# Patient Record
Sex: Female | Born: 1955 | Race: Black or African American | Hispanic: No | Smoking: Former smoker
Health system: Southern US, Community
[De-identification: ages and names within clinical notes are randomized; demographics above are authoritative.]

## PROBLEM LIST (undated history)

## (undated) DIAGNOSIS — M1990 Unspecified osteoarthritis, unspecified site: Secondary | ICD-10-CM

## (undated) DIAGNOSIS — R12 Heartburn: Secondary | ICD-10-CM

## (undated) DIAGNOSIS — J189 Pneumonia, unspecified organism: Secondary | ICD-10-CM

## (undated) DIAGNOSIS — K219 Gastro-esophageal reflux disease without esophagitis: Secondary | ICD-10-CM

## (undated) DIAGNOSIS — C50912 Malignant neoplasm of unspecified site of left female breast: Secondary | ICD-10-CM

## (undated) DIAGNOSIS — Z8489 Family history of other specified conditions: Secondary | ICD-10-CM

## (undated) DIAGNOSIS — D649 Anemia, unspecified: Secondary | ICD-10-CM

## (undated) HISTORY — DX: Unspecified osteoarthritis, unspecified site: M19.90

## (undated) HISTORY — DX: Heartburn: R12

## (undated) HISTORY — DX: Anemia, unspecified: D64.9

## (undated) HISTORY — PX: BREAST BIOPSY: SHX20

## (undated) HISTORY — PX: COLONOSCOPY: SHX5424

## (undated) HISTORY — PX: MASTECTOMY COMPLETE / SIMPLE W/ SENTINEL NODE BIOPSY: SUR846

## (undated) HISTORY — PX: INCISION AND DRAINAGE ABSCESS ANAL: SUR669

---
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
## 2000-05-26 ENCOUNTER — Encounter: Payer: Self-pay | Admitting: *Deleted

## 2000-05-26 ENCOUNTER — Encounter: Admission: RE | Admit: 2000-05-26 | Discharge: 2000-05-26 | Payer: Self-pay | Admitting: *Deleted

## 2001-09-01 ENCOUNTER — Other Ambulatory Visit: Admission: RE | Admit: 2001-09-01 | Discharge: 2001-09-01 | Payer: Self-pay | Admitting: Obstetrics and Gynecology

## 2002-05-31 ENCOUNTER — Encounter: Payer: Self-pay | Admitting: Obstetrics and Gynecology

## 2002-05-31 ENCOUNTER — Encounter: Admission: RE | Admit: 2002-05-31 | Discharge: 2002-05-31 | Payer: Self-pay | Admitting: Obstetrics and Gynecology

## 2002-10-02 ENCOUNTER — Other Ambulatory Visit: Admission: RE | Admit: 2002-10-02 | Discharge: 2002-10-02 | Payer: Self-pay | Admitting: Obstetrics and Gynecology

## 2017-11-01 DIAGNOSIS — C50912 Malignant neoplasm of unspecified site of left female breast: Secondary | ICD-10-CM

## 2017-11-01 HISTORY — DX: Malignant neoplasm of unspecified site of left female breast: C50.912

## 2017-11-10 LAB — HM PAP SMEAR

## 2017-11-17 ENCOUNTER — Other Ambulatory Visit: Payer: Self-pay | Admitting: Radiology

## 2017-11-23 ENCOUNTER — Other Ambulatory Visit: Payer: Self-pay | Admitting: *Deleted

## 2017-11-23 ENCOUNTER — Telehealth: Payer: Self-pay | Admitting: Oncology

## 2017-11-23 ENCOUNTER — Other Ambulatory Visit: Payer: Self-pay | Admitting: Oncology

## 2017-11-23 DIAGNOSIS — Z170 Estrogen receptor positive status [ER+]: Principal | ICD-10-CM | POA: Insufficient documentation

## 2017-11-23 DIAGNOSIS — C50412 Malignant neoplasm of upper-outer quadrant of left female breast: Secondary | ICD-10-CM | POA: Insufficient documentation

## 2017-11-23 NOTE — Telephone Encounter (Signed)
Confirmed morning BC appointment with patient for 11/30/17, [facility] patient no packet sent, appointment reminder sent

## 2017-11-28 ENCOUNTER — Other Ambulatory Visit: Payer: Self-pay | Admitting: Radiology

## 2017-11-28 NOTE — Progress Notes (Signed)
[facility]  [phone] [phone]     ID: Rita Roberts DOB: [birth year]  MR#: [id number]  ZHY#:[id number]  Patient Care Team: [place] Givens, MD as PCP - General (Obstetrics and Gynecology) Magrinat, Virgie Dad, MD as Consulting Physician (Oncology) Juanita Craver, MD as Consulting Physician (Gastroenterology) Magrinat, Virgie Dad, MD as Consulting Physician (Oncology) Gery Pray, MD as Consulting Physician (Radiation Oncology) Alphonsa Overall, MD as Consulting Physician (General Surgery) OTHER MD:  CHIEF COMPLAINT: Estrogen receptor positive breast cancer  CURRENT TREATMENT: Awaiting definitive surgery   HISTORY OF CURRENT ILLNESS: Rita Roberts had routine screening mammography on 11/09/2017 showing a possible abnormality in the left breast. She underwent unilateral diagnostic mammography with tomography and left breast ultrasonography at [facility] on 11/14/2017 showing: breast density category C. At the 2 o'clock left upper outer position of the breast, there is a 1.5 cm lobulated mass that is 2 cm from the nipple. At the 1 o'clock left upper outer position of the breast, there is a 0.8 cm mass that is 8 cm from the nipple. There are several areas of clustered calcifications thoughout the upper hemisphere of the breast.   Ultrsound of the left breast and axilla at [facility] on 11/17/2017 showed no evidence of malignancy in the left axilla.   Accordingly on 11/17/2017 she proceeded to biopsy of the left breast masses in question. 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 Prognostic indicators for the invasive component was significant for: estrogen receptor, 100% positive and progesterone receptor, 100% positive, both with strong staining intensity. Proliferation  marker Ki67 at 5%. HER2 not amplified with ratio of HER2/CEP17 signals 1.33 and average HER2 copied per cell 2.60.  Biopsy of 2 additional areas in the breast in different quadrants also showed ductal carcinoma in situ.  In brief the patient had 4 left breast biopsies, 3 showing ductal carcinoma in situ and 1 showing invasive ductal carcinoma as noted above  The patient's subsequent history is as detailed below.  INTERVAL HISTORY: Rita Roberts was evaluated in the multidisciplinary breast cancer clinic on 11/30/2017. Her case was also presented at the multidisciplinary breast cancer conference on the same day. At that time a preliminary plan was proposed: Breast MRI particularly to evaluate the contralateral breast; left mastectomy and sentinel lymph node sampling; Oncotype obtained on the invasive component; adjuvant radiation if appropriate; antiestrogens.   REVIEW OF SYSTEMS: Rita Roberts reports that she is turning 62 years old in June. She notes that she has occasional constipation, and she takes stool softeners to aid this. She denies unusual headaches, visual changes, nausea, vomiting, or dizziness. There has been no unusual cough, phlegm production, or pleurisy. This been no change in bowel or bladder habits. She denies unexplained fatigue or unexplained weight loss, bleeding, rash, or fever. A detailed review of systems was otherwise stable.    PAST MEDICAL HISTORY: Past Medical History:  Diagnosis Date  . Anemia   . Arthritis   . Heartburn       PAST SURGICAL HISTORY: Past Surgical History:  Procedure Laterality Date  . CESAREAN SECTION     Boil Lancing  FAMILY HISTORY Family History  Problem Relation Age of Onset  . Lung cancer Father     She notes that her father passed away at age 86 from lung cancer and was not a smoker.  Her mother passed away at age 52 from dementia. She has 3 brothers and 1 sister. She denies a family history of breast or ovarian cancer.   GYNECOLOGIC HISTORY:    No LMP recorded. 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 She is divorced. Her son, Rita Roberts is a Glass blower/designer in [place], [place]. She does not have any grandchildren. She belongs to [employer] in [place], [place]: not in place; at the 11/30/2017 visit the patient was given the appropriate documents to complete a Rita Roberts at her discretion   HEALTH MAINTENANCE: Social History   Tobacco Use  . Smoking status: Never Smoker  . Smokeless tobacco: Never Used  Substance Use Topics  . Alcohol use: No    Frequency: Never  . Drug use: No     Colonoscopy: scheduled in March 2019/ Dr. Collene Mares  PAP: 11/14/2017  Bone density: no  First mammogram: 1977   No Known Allergies  No current outpatient medications on file.   No current facility-administered medications for this visit.     OBJECTIVE: Middle-aged African-American woman who appears younger than stated age  26:   11/30/17 0911  BP: (!) 157/87  Pulse: 73  Resp: 18  Temp: 98.3 F (36.8 C)  SpO2: 100%     Body mass index is 32.75 kg/m.   Wt Readings from Last 3 Encounters:  11/30/17 190 lb 12.8 oz (86.5 kg)      ECOG FS:0 - Asymptomatic  Ocular: Sclerae unicteric, pupils round and equal Ear-nose-throat: Oropharynx clear and moist Lymphatic: No cervical or supraclavicular adenopathy Lungs no rales or rhonchi Heart regular rate and rhythm Abd soft, nontender, positive bowel sounds MSK no focal spinal tenderness, no joint edema Neuro: non-focal, well-oriented, appropriate affect Breasts: The right breast is unremarkable.  The left breast is status post recent biopsies.  There is no palpable mass.  There are no skin or nipple  changes of concern.  Both axillae are benign.  LAB RESULTS:  CMP     Component Value Date/Time   NA 142 11/30/2017 0839   K 3.7 11/30/2017 0839   CL 105 11/30/2017 0839   CO2 28 11/30/2017 0839   GLUCOSE 109 11/30/2017 0839   BUN 10 11/30/2017 0839   CALCIUM 9.9 11/30/2017 0839   PROT 7.6 11/30/2017 0839   ALBUMIN 4.1 11/30/2017 0839   AST 16 11/30/2017 0839   ALT 11 11/30/2017 0839   ALKPHOS 59 11/30/2017 0839   BILITOT 1.1 11/30/2017 0839   GFRNONAA >60 11/30/2017 0839   GFRAA >60 11/30/2017 0839    No results found for: TOTALPROTELP, ALBUMINELP, A1GS, A2GS, BETS, BETA2SER, GAMS, MSPIKE, SPEI  No results found for: KPAFRELGTCHN, LAMBDASER, [facility]  Lab Results  Component Value Date   WBC 4.2 11/30/2017   NEUTROABS 2.3 11/30/2017   HCT 41.5 11/30/2017   MCV 87.9 11/30/2017   PLT 247 11/30/2017    '@LASTCHEMISTRY'$ @  No results found for: LABCA2  No components found for: IPJASN053  No results for input(s): INR in the last 168 hours.  No results found for: LABCA2  No results found for: ZJQ734  No results found for: LPF790  No results found for: WIO973  No results  found for: CA2729  No components found for: HGQUANT  No results found for: CEA1 / No results found for: CEA1   No results found for: AFPTUMOR  No results found for: [place]  No results found for: PSA1  Appointment on 11/30/2017  Component Date Value Ref Range Status  . Sodium 11/30/2017 142  136 - 145 mmol/L Final  . Potassium 11/30/2017 3.7  3.3 - 4.7 mmol/L Final  . Chloride 11/30/2017 105  98 - 109 mmol/L Final  . CO2 11/30/2017 28  22 - 29 mmol/L Final  . Glucose, Bld 11/30/2017 109  70 - 140 mg/dL Final  . BUN 11/30/2017 10  7 - 26 mg/dL Final  . Creatinine 11/30/2017 0.98  0.60 - 1.10 mg/dL Final  . Calcium 11/30/2017 9.9  8.4 - 10.4 mg/dL Final  . Total Protein 11/30/2017 7.6  6.4 - 8.3 g/dL Final  . Albumin 11/30/2017 4.1  3.5 - 5.0 g/dL Final  . AST 11/30/2017 16  5 -  34 U/L Final  . ALT 11/30/2017 11  0 - 55 U/L Final  . Alkaline Phosphatase 11/30/2017 59  40 - 150 U/L Final  . Total Bilirubin 11/30/2017 1.1  0.2 - 1.2 mg/dL Final  . GFR, Est Non Af Am 11/30/2017 >60  >60 mL/min Final  . GFR, Est AFR Am 11/30/2017 >60  >60 mL/min Final   Comment: (NOTE) The eGFR has been calculated using the CKD EPI equation. This calculation has not been validated in all clinical situations. eGFR's persistently <60 mL/min signify possible Chronic Kidney Disease.   Georgiann Hahn gap 11/30/2017 9  3 - 11 Final   Performed at [facility], [place] [address], [place], [place] [address]  . WBC Count 11/30/2017 4.2  3.9 - 10.3 K/uL Final  . RBC 11/30/2017 4.72  3.70 - 5.45 MIL/uL Final  . Hemoglobin 11/30/2017 13.7  11.6 - 15.9 g/dL Final  . HCT 11/30/2017 41.5  34.8 - 46.6 % Final  . MCV 11/30/2017 87.9  79.5 - 101.0 fL Final  . MCH 11/30/2017 29.1  25.1 - 34.0 pg Final  . MCHC 11/30/2017 33.1  31.5 - 36.0 g/dL Final  . RDW 11/30/2017 13.6  11.2 - 16.1 % Final  . Platelet Count 11/30/2017 247  145 - 400 K/uL Final  . Neutrophils Relative % 11/30/2017 54  % Final  . Neutro Abs 11/30/2017 2.3  1.5 - 6.5 K/uL Final  . Lymphocytes Relative 11/30/2017 35  % Final  . Lymphs Abs 11/30/2017 1.4  0.9 - 3.3 K/uL Final  . Monocytes Relative 11/30/2017 9  % Final  . Monocytes Absolute 11/30/2017 0.4  0.1 - 0.9 K/uL Final  . Eosinophils Relative 11/30/2017 1  % Final  . Eosinophils Absolute 11/30/2017 0.0  0.0 - 0.5 K/uL Final  . Basophils Relative 11/30/2017 1  % Final  . Basophils Absolute 11/30/2017 0.0  0.0 - 0.1 K/uL Final   Performed at [facility], [place] [address] [place], [place] [address]    (this displays the last labs from the last 3 days)  No results found for: TOTALPROTELP, ALBUMINELP, A1GS, A2GS, BETS, BETA2SER, GAMS, MSPIKE, SPEI (this displays SPEP labs)  No results found for: KPAFRELGTCHN, LAMBDASER,  KAPLAMBRATIO (kappa/lambda light chains)  No results found for: HGBA, HGBA2QUANT, HGBFQUANT, HGBSQUAN (Hemoglobinopathy evaluation)   No results found for: LDH  No results found for: IRON, TIBC, IRONPCTSAT (Iron and TIBC)  No results found for: FERRITIN  Urinalysis No  results found for: COLORURINE, APPEARANCEUR, LABSPEC, PHURINE, GLUCOSEU, HGBUR, BILIRUBINUR, KETONESUR, PROTEINUR, UROBILINOGEN, NITRITE, LEUKOCYTESUR   STUDIES: Outside studies reviewed with the patient  ELIGIBLE FOR AVAILABLE RESEARCH PROTOCOL: no  ASSESSMENT: 62 y.o. [place], [place] Woman status post left breast upper outer quadrant biopsy 11/17/2017 showing invasive ductal carcinoma, grade 1, estrogen and progesterone receptor positive, HER-2 not amplified, with an MIB-1 of 5%.  (a) 3 additional left breast biopsies in different quadrants showed ductal carcinoma in situ  (1) left mastectomy and sentinel lymph node sampling planned  (2) Oncotype to be obtained from the definitive surgical sample: Chemotherapy not anticipated  (3) adjuvant radiation likely not needed  (4) antiestrogens to start at the completion of local treatment  PLAN: We spent the better part of today's hour-long appointment discussing the biology of her diagnosis and the specifics of her situation. We first reviewed the fact that cancer is not one disease but more than 100 different diseases and that it is important to keep them separate-- otherwise when friends and relatives discuss their own cancer experiences with Rita Roberts confusion can result. Similarly we explained that if breast cancer spreads to the bone or liver, the patient would not have bone cancer or liver cancer, but breast cancer in the bone and breast cancer in the liver: one cancer in three places-- not 3 different cancers which otherwise would have to be treated in 3 different ways.  We discussed the difference between local and systemic therapy. In terms of loco-regional  treatment, lumpectomy plus radiation is equivalent to mastectomy as far as survival is concerned.  However, given that she has multiple noninvasive areas in different quadrants of the left breast, mastectomy cannot be avoided in her case.   We also noted that in terms of sequencing of treatments, whether systemic therapy or surgery is done first does not affect the ultimate outcome.  If there is to be in a significant delay in her surgery, because of reconstruction or other concerns, we can always start anti-estrogens neoadjuvantly.  We then discussed the rationale for systemic therapy. There is some risk that this cancer may have already spread to other parts of her body. Patients frequently ask at this point about bone scans, CAT scans and PET scans to find out if they have occult breast cancer somewhere else. The problem is that in early stage disease we are much more likely to find false positives then true cancers and this would expose the patient to unnecessary procedures as well as unnecessary radiation. Scans cannot answer the question the patient really would like to know, which is whether she has microscopic disease elsewhere in her body. For those reasons we do not recommend them.  Of course we would proceed to aggressive evaluation of any symptoms that might suggest metastatic disease, but that is not the case here.  Next we went over the options for systemic therapy which are anti-estrogens, anti-HER-2 immunotherapy, and chemotherapy. Rita Roberts does not meet criteria for anti-HER-2 immunotherapy. She is a good candidate for anti-estrogens.  The question of chemotherapy is more complicated. Chemotherapy is most effective in rapidly growing, aggressive tumors. It is much less effective in low-grade, slow growing cancers, like Rita Roberts 's. For that reason we are going to request an Oncotype from the definitive surgical sample, as suggested by NCCN guidelines. That will help [place] make a definitive decision  regarding chemotherapy in this case, but she understands I do not anticipate she will need chemotherapy  Finally we discussed her noninvasive disease.  She understands this is cured with mastectomy.  Accordingly the final plan is for surgery, likely no chemotherapy and likely no radiation.  She will see me in approximately 5 weeks to discuss anti-estrogens  Today I gave her documents that she can complete a healthcare power of attorney and get it notarized.  She will let me know at the next visit whether she was able to accomplish that  [place]aler has a good understanding of the overall plan. She agrees with it. She knows the goal of treatment in her case is cure. She will call with any problems that may develop before her next visit here.   Magrinat, Virgie Dad, MD  11/30/17 11:16 AM Medical Oncology and Hematology [facility] [address] [place], [place] [address] Tel. [phone]    Fax. [phone]  This document serves as a record of services personally performed by Lurline Del, MD. It was created on his behalf by Sheron Nightingale, a trained medical scribe. The creation of this record is based on the scribe's personal observations and the provider's statements to them.   I have reviewed the above documentation for accuracy and completeness, and I agree with the above.

## 2017-11-30 ENCOUNTER — Other Ambulatory Visit: Payer: Self-pay | Admitting: *Deleted

## 2017-11-30 ENCOUNTER — Encounter: Payer: Self-pay | Admitting: *Deleted

## 2017-11-30 ENCOUNTER — Inpatient Hospital Stay: Payer: BC Managed Care – PPO | Attending: Oncology | Admitting: Oncology

## 2017-11-30 ENCOUNTER — Other Ambulatory Visit: Payer: Self-pay

## 2017-11-30 ENCOUNTER — Ambulatory Visit: Payer: BC Managed Care – PPO | Attending: Surgery | Admitting: Physical Therapy

## 2017-11-30 ENCOUNTER — Encounter: Payer: Self-pay | Admitting: Physical Therapy

## 2017-11-30 ENCOUNTER — Inpatient Hospital Stay: Payer: BC Managed Care – PPO

## 2017-11-30 ENCOUNTER — Encounter: Payer: Self-pay | Admitting: Oncology

## 2017-11-30 ENCOUNTER — Ambulatory Visit
Admission: RE | Admit: 2017-11-30 | Discharge: 2017-11-30 | Disposition: A | Discharge status: Home / Self Care | Payer: BC Managed Care – PPO | Source: Ambulatory Visit | Attending: Radiation Oncology | Admitting: Radiation Oncology

## 2017-11-30 VITALS — BP 157/87 | HR 73 | Temp 98.3°F | Resp 18 | Ht 64.0 in | Wt 190.8 lb

## 2017-11-30 DIAGNOSIS — Z170 Estrogen receptor positive status [ER+]: Secondary | ICD-10-CM | POA: Diagnosis not present

## 2017-11-30 DIAGNOSIS — D0512 Intraductal carcinoma in situ of left breast: Secondary | ICD-10-CM

## 2017-11-30 DIAGNOSIS — C50412 Malignant neoplasm of upper-outer quadrant of left female breast: Secondary | ICD-10-CM | POA: Diagnosis not present

## 2017-11-30 DIAGNOSIS — R293 Abnormal posture: Secondary | ICD-10-CM | POA: Diagnosis present

## 2017-11-30 LAB — CBC WITH DIFFERENTIAL (CANCER CENTER ONLY)
BASOS PCT: 1 %
Basophils Absolute: 0 10*3/uL (ref 0.0–0.1)
EOS ABS: 0 10*3/uL (ref 0.0–0.5)
Eosinophils Relative: 1 %
HEMATOCRIT: 41.5 % (ref 34.8–46.6)
HEMOGLOBIN: 13.7 g/dL (ref 11.6–15.9)
Lymphocytes Relative: 35 %
Lymphs Abs: 1.4 10*3/uL (ref 0.9–3.3)
MCH: 29.1 pg (ref 25.1–34.0)
MCHC: 33.1 g/dL (ref 31.5–36.0)
MCV: 87.9 fL (ref 79.5–101.0)
MONOS PCT: 9 %
Monocytes Absolute: 0.4 10*3/uL (ref 0.1–0.9)
NEUTROS ABS: 2.3 10*3/uL (ref 1.5–6.5)
Neutrophils Relative %: 54 %
Platelet Count: 247 10*3/uL (ref 145–400)
RBC: 4.72 MIL/uL (ref 3.70–5.45)
RDW: 13.6 % (ref 11.2–16.1)
WBC: 4.2 10*3/uL (ref 3.9–10.3)

## 2017-11-30 LAB — CMP (CANCER CENTER ONLY)
ALK PHOS: 59 U/L (ref 40–150)
ALT: 11 U/L (ref 0–55)
ANION GAP: 9 (ref 3–11)
AST: 16 U/L (ref 5–34)
Albumin: 4.1 g/dL (ref 3.5–5.0)
BUN: 10 mg/dL (ref 7–26)
CALCIUM: 9.9 mg/dL (ref 8.4–10.4)
CHLORIDE: 105 mmol/L (ref 98–109)
CO2: 28 mmol/L (ref 22–29)
Creatinine: 0.98 mg/dL (ref 0.60–1.10)
Glucose, Bld: 109 mg/dL (ref 70–140)
Potassium: 3.7 mmol/L (ref 3.3–4.7)
SODIUM: 142 mmol/L (ref 136–145)
Total Bilirubin: 1.1 mg/dL (ref 0.2–1.2)
Total Protein: 7.6 g/dL (ref 6.4–8.3)

## 2017-11-30 NOTE — Progress Notes (Signed)
Radiation Oncology         [phone] ________________________________  Initial outpatient Consultation  Name: Rita Roberts MRN: [id number]  Date: 11/30/2017  DOB: January 23, 1956  WI:[id number], Rita Munson, MD  Alphonsa Overall, MD   REFERRING PHYSICIAN: Alphonsa Overall, MD  DIAGNOSIS: The encounter diagnosis was Malignant neoplasm of upper-outer quadrant of left breast in female, estrogen receptor positive ([place]).   HISTORY OF PRESENT ILLNESS::Rita Roberts is a 62 y.o. female who is seen as part of the multidisciplinary breast clinic today. Earlier the patient's imaging and pathology was reviewed at the multidisciplinary breast conference. She underwent routine screening mammography on 11/09/2017 showing a possible abnormality in the left breast. She underwent unilateral diagnostic mammography with tomography and left breast ultrasonography at [facility] on 11/14/2017 showing: breast density category C. At the 2 o'clock left upper outer position of the breast, there is a 1.5 cm lobulated mass that is 2 cm from the nipple. At the 1 o'clock left upper outer position of the breast, there is a 0.8 cm mass that is 8 cm from the nipple. There are several areas of clustered calcifications thoughout the upper hemisphere of the breast. Ultrsound of the left breast and axilla at [facility] on 11/17/2017 showed no evidence of malignancy in the left axilla.   Accordingly on 11/17/2017 she proceeded to biopsy of the left breast masses in question. 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 Prognostic indicators for the invasive component was significant for: estrogen receptor, 100% positive and progesterone receptor, 100% positive, both with strong staining intensity. Proliferation marker Ki67 at 5%. HER2 not amplified with ratio of  HER2/CEP17 signals 1.33 and average HER2 copied per cell 2.60.  Biopsy of 2 additional areas in the breast in different quadrants also showed ductal carcinoma in situ.  In brief the patient had 4 left breast biopsies, 3 showing ductal carcinoma in situ and 1 showing invasive ductal carcinoma as noted above. With this information the patient is now seen for evaluation and management recommendations.   GYNECOLOGIC HISTORY:  No LMP recorded. Menarche: 62 years old Age at first live birth: 62 years old GXP1 LMP December 2011 Contraceptive: oral pills from 4196-2229 with no complications HRT: no     PREVIOUS RADIATION THERAPY: No  PAST MEDICAL HISTORY:  has a past medical history of Anemia, Arthritis, and Heartburn.    PAST SURGICAL HISTORY: Past Surgical History:  Procedure Laterality Date  . CESAREAN SECTION      FAMILY HISTORY: family history includes Lung cancer in her father.  SOCIAL HISTORY:  reports that  has never smoked. she has never used smokeless tobacco. She reports that she does not drink alcohol or use drugs. 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 She is divorced. Her son, Rita Roberts is a Glass blower/designer in [place], [place]. She does not have any grandchildren. She belongs to [employer] in [place], [place]: Patient has no known allergies.  MEDICATIONS:  No current outpatient medications on file.   No current facility-administered medications for this encounter.     REVIEW OF SYSTEMS: REVIEW OF SYSTEMS: A 10+ POINT REVIEW OF SYSTEMS WAS OBTAINED including neurology, dermatology, psychiatry, cardiac, respiratory, lymph, extremities, GI, GU, musculoskeletal, constitutional, reproductive, HEENT. All pertinent positives are noted  in the HPI. All others are negative. Prior to diagnosis patient denied any pain in the breast area nipple discharge or bleeding   PHYSICAL EXAM:  Vitals - 1 value  per visit 05/26/3663  SYSTOLIC 403  DIASTOLIC 87  Pulse 73  Temperature 98.3  Respirations 18  Weight (lb) 190.8  Height _0   BMI 32.75  VISIT REPORT    Lungs are clear to auscultation bilaterally. Heart has regular rate and rhythm. No palpable cervical, supraclavicular, or axillary adenopathy. Abdomen soft, non-tender, normal bowel sounds. Examination of the right breast reveals no palpable mass nipple discharge or bleeding. Examination of the left breast reveals Steri-Strips in place in 2 locations of the upper breast. The patient also has some bruising in the upper outer quadrant of left breast. No palpable mass nipple discharge or bleeding.   ECOG = 0  0 - Asymptomatic (Fully active, able to carry on all predisease activities without restriction)  1 - Symptomatic but completely ambulatory (Restricted in physically strenuous activity but ambulatory and able to carry out work of a light or sedentary nature. For example, light housework, office work)  2 - Symptomatic, <50% in bed during the day (Ambulatory and capable of all self care but unable to carry out any work activities. Up and about more than 50% of waking hours)  3 - Symptomatic, >50% in bed, but not bedbound (Capable of only limited self-care, confined to bed or chair 50% or more of waking hours)  4 - Bedbound (Completely disabled. Cannot carry on any self-care. Totally confined to bed or chair)  5 - Death   Rita Roberts MM, Rita Roberts, Rita Roberts, et al. [phone]). "Toxicity and response criteria of the [facility]". [place] Oncol. 5 (6): 649-55  LABORATORY DATA:  Lab Results  Component Value Date   WBC 4.2 11/30/2017   HCT 41.5 11/30/2017   MCV 87.9 11/30/2017   PLT 247 11/30/2017   NEUTROABS 2.3 11/30/2017   Lab Results  Component Value Date   NA 142 11/30/2017   K 3.7 11/30/2017   CL 105 11/30/2017   CO2 28 11/30/2017   GLUCOSE 109 11/30/2017   CALCIUM 9.9 11/30/2017      RADIOGRAPHY:  No results found.    IMPRESSION: 62 y.o. [place], [place] Woman status post left breast upper outer quadrant biopsy 11/17/2017 showing invasive ductal carcinoma, grade 1, estrogen and progesterone receptor positive, HER-2 not amplified, with an MIB-1 of 5%.  3 additional left breast biopsies in different quadrants showed ductal carcinoma in situ. Given the extent of disease within the left breast she would not be a candidate for breast conserving surgery. We would recommend mastectomy and sentinel node procedure as part of her definitive management. If thePatient is found to have small lesions and no evidence of lymph node metastasis, clear margins then likely she would not require postmastectomy radiation therapy as part of her overall management.    PLAN:   (1) left mastectomy and sentinel lymph node sampling planned  (2) Oncotype to be obtained from the definitive surgical sample: Chemotherapy not anticipated  (3) adjuvant radiation likely not needed  (4) antiestrogens to start at the completion of local treatment       ------------------------------------------------  Blair Promise, PhD, MD

## 2017-11-30 NOTE — Progress Notes (Signed)
Nutrition Assessment  Reason for Assessment:  Pt seen in Breast Clinic  ASSESSMENT:   62 year old female with new diagnosis of breast cancer.  Past medical history reviewed  Patient reports good appetite  Medications:  reviewed  Labs: reviewed  Anthropometrics:   Height: 64 inches Weight: 190 lb BMI: 32   NUTRITION DIAGNOSIS: Food and nutrition related knowledge deficit related to new diagnosis of breast cancer as evidenced by no prior need for nutrition related information.  INTERVENTION:   Discussed and provided packet of information regarding nutritional tips for breast cancer patients.  Questions answered.  Teachback method used.  Contact information provided and patient knows to contact me with questions/concerns.    MONITORING, EVALUATION, and GOAL: Pt will consume a healthy plant based diet to maintain lean body mass throughout treatment.   Mialynn Shelvin B. Zenia Resides, [place], [place] Registered Dietitian [phone] (pager)

## 2017-11-30 NOTE — Patient Instructions (Signed)

## 2017-11-30 NOTE — Therapy (Signed)
[place], [place], [address] Phone: [phone]   Fax:  [phone]  Physical Therapy Evaluation  Patient Details  Name: Rita Roberts MRN: [id number] Date of Birth: 06/15/1956 Referring Provider: Dr. Alphonsa Overall   Encounter Date: 11/30/2017  PT End of Session - 11/30/17 1159    Visit Number  1    Number of Visits  2    Date for PT Re-Evaluation  01/25/18    PT Start Time  2992    PT Stop Time  0942 Also saw pt from [phone] for a total of 32 minutes    PT Time Calculation (min)  28 min    Activity Tolerance  Patient tolerated treatment well    Behavior During Therapy  [facility] for tasks assessed/performed       Past Medical History:  Diagnosis Date  . Anemia   . Arthritis   . Heartburn     Past Surgical History:  Procedure Laterality Date  . CESAREAN SECTION      There were no vitals filed for this visit.   Subjective Assessment - 11/30/17 1150    Subjective  Patient reports she is here today to be seen by her medical team for her newly diagnosed left breast cancer.    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 It measures 1.4 cm and is located in the upper outer quadrant. It is ER/PR positive and HER2 negative with a Ki67 of 5%. She has no other medical problems.    Patient Stated Goals  reduce lymphedema risk and learn post op HEP     Currently in Pain?  No/denies         [facility] PT Assessment - 11/30/17 0001      Assessment   Medical Diagnosis  Left breast cancer    Referring Provider  Dr. Alphonsa Overall    Onset Date/Surgical Date  11/09/17    Hand Dominance  Right    Prior Therapy  none      Precautions   Precautions  Other (comment)    Precaution Comments  active cancer      Restrictions   Weight Bearing Restrictions  No      Balance Screen   Has the patient fallen in the past 6 months  No    Has the  patient had a decrease in activity level because of a fear of falling?   No    Is the patient reluctant to leave their home because of a fear of falling?   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    Leisure  She walks 3x/week for 15 minutes      Cognition   Overall Cognitive Status  Within Functional Limits for tasks assessed      Posture/Postural Control   Posture/Postural Control  Postural limitations    Postural Limitations  Rounded Shoulders;Forward head      ROM / Strength   AROM / PROM / Strength  AROM;Strength      AROM   AROM Assessment Site  Shoulder;Cervical    Right/Left Shoulder  Right;Left    Right Shoulder Extension  45 Degrees    Right Shoulder Flexion  148 Degrees    Right Shoulder ABduction  150 Degrees    Right Shoulder Internal Rotation  54 Degrees    Right Shoulder External Rotation  86 Degrees    Left Shoulder Extension  52 Degrees    Left Shoulder Flexion  155 Degrees    Left Shoulder ABduction  155 Degrees    Left Shoulder Internal Rotation  58 Degrees    Left Shoulder External Rotation  88 Degrees    Cervical Flexion  WNL    Cervical Extension  WNL    Cervical - Right Side Bend  WNL    Cervical - Left Side Bend  WNL    Cervical - Right Rotation  WNL    Cervical - Left Rotation  WNL      Strength   Overall Strength  Within functional limits for tasks performed        LYMPHEDEMA/ONCOLOGY QUESTIONNAIRE - 11/30/17 1156      Type   Cancer Type  Left breast cancer      Lymphedema Assessments   Lymphedema Assessments  Upper extremities      Right Upper Extremity Lymphedema   10 cm Proximal to Olecranon Process  32.7 cm    Olecranon Process  27.8 cm    10 cm Proximal to Ulnar Styloid Process  25.5 cm     Just Proximal to Ulnar Styloid Process  18.6 cm    Across Hand at [employer]  20.3 cm    At [place] of 2nd Digit  7.2 cm      Left Upper Extremity Lymphedema   10 cm Proximal to Olecranon Process  30.7 cm    Olecranon Process  26.8 cm    10 cm Proximal to Ulnar Styloid Process  24.6 cm    Just Proximal to Ulnar Styloid Process  17.5 cm    Across Hand at [employer]  20.2 cm    At [place] of 2nd Digit  6.7 cm          Objective measurements completed on examination: See above findings.      Patient was instructed today in a home exercise program today for post op shoulder range of motion. These included active assist shoulder flexion in sitting, scapular retraction, wall walking with shoulder abduction, and hands behind head external rotation.  She was encouraged to do these twice a day, holding 3 seconds and repeating 5 times when permitted by her physician.      PT Education - 11/30/17 1158    Education provided  Yes    Education Details  Lymphedema risk reduction and post op shoulder ROM HEP    Person(s) Educated  Patient    Methods  Explanation;Demonstration;Handout    Comprehension  Returned demonstration;Verbalized understanding          PT Long Term Goals - 11/30/17 1203      PT LONG TERM GOAL #1   Title  Patient will demonstrate she has returned to baseline with shoulder ROM and function post-operatively.    Time  [facility] Goals - 11/30/17 1204      Patient will be able to verbalize understanding of pertinent lymphedema risk reduction practices relevant to her diagnosis specifically related to skin care.   Time  1    Period  Days  Status  Achieved      Patient will be able to return demonstrate and/or verbalize understanding of the post-op home exercise program related to regaining shoulder range of motion.   Time  1    Period  Days    Status  Achieved      Patient will be able to verbalize understanding of the  importance of attending the postoperative After Breast Cancer Class for further lymphedema risk reduction education and therapeutic exercise.   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 It measures 1.4 cm and is located in the upper outer quadrant. It is ER/PR positive and HER2 negative with a Ki67 of 5%. She has no other medical problems. Her multidisciplinary medical team met prior other assessments to determine a recommended treatment plan. She is planning to have a left mastectomy and sentinel node biopsy followed by Oncotype testing and anti-estrogen therapy. She will benefit from a post op PT visit to determine PT needs.    History and Personal Factors relevant to plan of care:  Lives alone    Clinical Presentation  Stable    Clinical Decision Making  Low    Rehab Potential  Excellent    Clinical Impairments Affecting Rehab Potential  None    PT Frequency  -- Eval and 1 f/u visit    PT Treatment/Interventions  ADLs/Self Care Home Management;Patient/family education;Therapeutic exercise    PT Next Visit Plan  Will reassess 3-4 weeks post op    PT Home Exercise Plan  Post op shoulder ROM HEP    Consulted and Agree with Plan of Care  Patient       Patient will benefit from skilled therapeutic intervention in order to improve the following deficits and impairments:  Decreased knowledge of precautions, Impaired UE functional use, Postural dysfunction, Decreased range of motion, Pain  Visit Diagnosis: Malignant neoplasm of upper-outer quadrant of left breast in female, estrogen receptor positive ([place]) - Plan: PT plan of care cert/re-cert  Abnormal posture - Plan: PT plan of care cert/re-cert   Patient will follow up at outpatient cancer rehab 3-4 weeks following surgery.  If the patient requires physical therapy at that  time, a specific plan will be dictated and sent to the referring physician for approval. The patient was educated today on appropriate basic range of motion exercises to begin post operatively and the importance of attending the After Breast Cancer class following surgery.  Patient was educated today on lymphedema risk reduction practices as it pertains to recommendations that will benefit the patient immediately following surgery.  She verbalized good understanding.      Problem List Patient Active Problem List   Diagnosis Date Noted  . Malignant neoplasm of upper-outer quadrant of left breast in female, estrogen receptor positive ([place]) 11/23/2017    Annia Friendly, PT 11/30/17 12:06 PM  [place], [place], [address] Phone: [phone]   Fax:  [phone]  Name: Rita Roberts MRN: [id number] Date of Birth: January 27, 1956

## 2017-11-30 NOTE — Progress Notes (Signed)
Clinical Social Work [place] Psychosocial Distress Screening [place]  Patient completed distress screening protocol and scored a 1 on the Psychosocial Distress Thermometer which indicates mild distress. Clinical Social Worker met with patient in [facility] to assess for distress and other psychosocial needs. Patient stated she was feeling "nervous" but felt "better" after meeting with the treatment team and getting more information on her treatment plan. CSW and patient discussed common feeling and emotions when being diagnosed with cancer, and the importance of support during treatment. CSW informed patient of the support team and support services at [facility]. CSW provided contact information and encouraged patient to call with any questions or concerns.  ONCBCN DISTRESS SCREENING 11/30/2017  Screening Type Initial Screening  Distress experienced in past week (1-10) 1  Emotional problem type Nervousness/Anxiety  Physical Problem type Sleep/insomnia  Physician notified of physical symptoms Yes     Johnnye Lana, MSW, LCSW, OSW-C Clinical Social Worker [place] [phone]

## 2017-12-07 ENCOUNTER — Telehealth: Payer: Self-pay | Admitting: *Deleted

## 2017-12-07 ENCOUNTER — Ambulatory Visit (HOSPITAL_COMMUNITY)
Admission: RE | Admit: 2017-12-07 | Discharge: 2017-12-07 | Disposition: A | Discharge status: Home / Self Care | Payer: BC Managed Care – PPO | Source: Ambulatory Visit | Attending: Surgery | Admitting: Surgery

## 2017-12-07 DIAGNOSIS — Z170 Estrogen receptor positive status [ER+]: Secondary | ICD-10-CM | POA: Insufficient documentation

## 2017-12-07 DIAGNOSIS — C50412 Malignant neoplasm of upper-outer quadrant of left female breast: Secondary | ICD-10-CM | POA: Diagnosis present

## 2017-12-07 MED ORDER — GADOBENATE DIMEGLUMINE 529 MG/ML IV SOLN
20.0000 mL | Freq: Once | INTRAVENOUS | Status: AC | PRN
  Administered 2017-12-07: 18 mL via INTRAVENOUS

## 2017-12-07 NOTE — Telephone Encounter (Signed)
Spoke with pt concerning [place] from 1.30.19. Denies questions or concerns regarding dx or treatment care plan. Encourage pt to call with needs. Received verbal understanding. Contact information provided. Pt relate she did well with breast MRI.

## 2017-12-08 ENCOUNTER — Ambulatory Visit: Payer: Self-pay | Admitting: Surgery

## 2017-12-08 DIAGNOSIS — C50912 Malignant neoplasm of unspecified site of left female breast: Secondary | ICD-10-CM

## 2017-12-16 ENCOUNTER — Other Ambulatory Visit: Payer: Self-pay | Admitting: *Deleted

## 2017-12-16 MED ORDER — ANASTROZOLE 1 MG PO TABS: 1.0000 mg | ORAL_TABLET | Freq: Every day | ORAL | 2 refills | Status: DC

## 2017-12-16 NOTE — Telephone Encounter (Signed)
Spoke with patient concerning her appointment with Dr. Jana Hakim 3/11.  She is not having surgery until 4/11 due to going out of town.  Confirmed new appointment for 03/06/18 at 230pm.  Also informed her that Dr. Jana Hakim would like her to start anastrozole in the meantime since it will be awhile before surgery.  RX called into her pharmacy.

## 2018-01-02 LAB — HM COLONOSCOPY

## 2018-01-09 ENCOUNTER — Telehealth: Payer: Self-pay | Admitting: *Deleted

## 2018-01-09 ENCOUNTER — Ambulatory Visit: Payer: BC Managed Care – PPO | Admitting: Oncology

## 2018-01-09 MED ORDER — ANASTROZOLE 1 MG PO TABS: 1.0000 mg | ORAL_TABLET | Freq: Every day | ORAL | 2 refills | Status: DC

## 2018-01-09 NOTE — Telephone Encounter (Signed)
Patient called to get Anastrozole resent to her pharmacy. She did not pick up prescription and pharmacy will not release without a new script being sent.   Reorder sent to [facility] on [address].

## 2018-01-31 ENCOUNTER — Other Ambulatory Visit: Payer: Self-pay | Admitting: Obstetrics and Gynecology

## 2018-01-31 NOTE — Pre-Procedure Instructions (Signed)
Rita Roberts  01/31/2018      [facility] [place] [address] [place] [address] Phone: [phone] Fax: [phone]    Your procedure is scheduled on April 11  Report to [facility] at [place].M.  Call this number if you have problems the morning of surgery:  [phone]   Remember:  Do not eat food or drink liquids after midnight.  Continue all medications as directed by your physician except follow these medication instructions before surgery below   Take these medicines the morning of surgery with A SIP OF WATER NONE  7 days prior to surgery STOP taking any Aspirin(unless otherwise instructed by your surgeon), Aleve, Naproxen, Ibuprofen, Motrin, Advil, Goody's, BC's, all herbal medications, fish oil, and all vitamins     Do not wear jewelry, make-up or nail polish.  Do not wear lotions, powders, or perfumes, or deodorant.  Do not shave 48 hours prior to surgery.    Do not bring valuables to the hospital.  [facility] is not responsible for any belongings or valuables.  Contacts, dentures or bridgework may not be worn into surgery.  Leave your suitcase in the car.  After surgery it may be brought to your room.  For patients admitted to the hospital, discharge time will be determined by your treatment team.  Patients discharged the day of surgery will not be allowed to drive home.    Special instructions:   [place]- Preparing For Surgery  Before surgery, you can play an important role. Because skin is not sterile, your skin needs to be as free of germs as possible. You can reduce the number of germs on your skin by washing with CHG (chlorahexidine gluconate) Soap before surgery.  CHG is an antiseptic cleaner which kills germs and bonds with the skin to continue killing germs even after washing.  Please do not use if you have an allergy to CHG  or antibacterial soaps. If your skin becomes reddened/irritated stop using the CHG.  Do not shave (including legs and underarms) for at least 48 hours prior to first CHG shower. It is OK to shave your face.  Please follow these instructions carefully.   1. Shower the NIGHT BEFORE SURGERY and the MORNING OF SURGERY with CHG.   2. If you chose to wash your hair, wash your hair first as usual with your normal shampoo.  3. After you shampoo, rinse your hair and body thoroughly to remove the shampoo.  4. Use CHG as you would any other liquid soap. You can apply CHG directly to the skin and wash gently with a scrungie or a clean washcloth.   5. Apply the CHG Soap to your body ONLY FROM THE NECK DOWN.  Do not use on open wounds or open sores. Avoid contact with your eyes, ears, mouth and genitals (private parts). Wash Face and genitals (private parts)  with your normal soap.  6. Wash thoroughly, paying special attention to the area where your surgery will be performed.  7. Thoroughly rinse your body with warm water from the neck down.  8. DO NOT shower/wash with your normal soap after using and rinsing off the CHG Soap.  9. Pat yourself dry with a CLEAN TOWEL.  10. Wear CLEAN PAJAMAS to bed the night before surgery, wear comfortable clothes the morning of surgery  11. Place  CLEAN SHEETS on your bed the night of your first shower and DO NOT SLEEP WITH PETS.    Day of Surgery: Do not apply any deodorants/lotions. Please wear clean clothes to the hospital/surgery center.      Please read over the following fact sheets that you were given.

## 2018-02-01 ENCOUNTER — Other Ambulatory Visit: Payer: Self-pay

## 2018-02-01 ENCOUNTER — Encounter (HOSPITAL_COMMUNITY)
Admission: RE | Admit: 2018-02-01 | Discharge: 2018-02-01 | Disposition: A | Discharge status: Home / Self Care | Payer: BC Managed Care – PPO | Source: Ambulatory Visit | Attending: Surgery | Admitting: Surgery

## 2018-02-01 ENCOUNTER — Encounter (HOSPITAL_COMMUNITY): Payer: Self-pay | Admitting: *Deleted

## 2018-02-01 DIAGNOSIS — Z01810 Encounter for preprocedural cardiovascular examination: Secondary | ICD-10-CM | POA: Insufficient documentation

## 2018-02-01 DIAGNOSIS — Z01812 Encounter for preprocedural laboratory examination: Secondary | ICD-10-CM | POA: Diagnosis present

## 2018-02-01 DIAGNOSIS — R030 Elevated blood-pressure reading, without diagnosis of hypertension: Secondary | ICD-10-CM | POA: Diagnosis not present

## 2018-02-01 LAB — BASIC METABOLIC PANEL
ANION GAP: 9 (ref 5–15)
BUN: 10 mg/dL (ref 6–20)
CO2: 24 mmol/L (ref 22–32)
CREATININE: 0.79 mg/dL (ref 0.44–1.00)
Calcium: 9.4 mg/dL (ref 8.9–10.3)
Chloride: 107 mmol/L (ref 101–111)
GFR calc non Af Amer: >60 mL/min (ref >60)
Glucose, Bld: 91 mg/dL (ref 65–99)
POTASSIUM: 3.6 mmol/L (ref 3.5–5.1)
SODIUM: 140 mmol/L (ref 135–145)

## 2018-02-01 LAB — HEMOGLOBIN: Hemoglobin: 13.6 g/dL (ref 12.0–15.0)

## 2018-02-01 NOTE — Progress Notes (Signed)
PCP - Naima Dillard Cardiologist - denies  Chest x-ray - not needed EKG - 02/01/18 Stress Test - denies ECHO - denies Cardiac Cath - denies   Anesthesia review: yes elevated pressure at PAT ekg Obtained, educated patient to reach out to Dr. Charlesetta Garibaldi for pressure management and suggestions.  Patient is stressed from recent trip and jet lag.  Patient also has a paper that is due today that she stated that she needs to get home to complete.  Patient stated that she just needs to get out of here.  Patient denies shortness of breath, fever, cough and chest pain at PAT appointment   Patient verbalized understanding of instructions that were given to them at the PAT appointment. Patient was also instructed that they will need to review over the PAT instructions again at home before surgery.

## 2018-02-08 NOTE — Anesthesia Preprocedure Evaluation (Addendum)
Anesthesia Evaluation  Patient identified by MRN, date of birth, ID band Patient awake    Reviewed: Allergy & Precautions, H&P , NPO status , Patient's Chart, lab work & pertinent test results  Airway Mallampati: II  TM Distance: >3 FB Neck ROM: Full    Dental no notable dental hx. (+) Teeth Intact, Dental Advisory Given   Pulmonary neg pulmonary ROS, former smoker,    Pulmonary exam normal breath sounds clear to auscultation       Cardiovascular Exercise Tolerance: Good negative cardio ROS   Rhythm:Regular Rate:Normal     Neuro/Psych negative neurological ROS  negative psych ROS   GI/Hepatic negative GI ROS, Neg liver ROS,   Endo/Other  negative endocrine ROS  Renal/GU negative Renal ROS  negative genitourinary   Musculoskeletal  (+) Arthritis , Osteoarthritis,    Abdominal   Peds  Hematology negative hematology ROS (+) anemia ,   Anesthesia Other Findings   Reproductive/Obstetrics negative OB ROS                            Anesthesia Physical Anesthesia Plan  ASA: II  Anesthesia Plan: General   Post-op Pain Management:  Regional for Post-op pain   Induction: Intravenous  PONV Risk Score and Plan: 4 or greater and Ondansetron, Dexamethasone and Midazolam  Airway Management Planned: LMA  Additional Equipment:   Intra-op Plan:   Post-operative Plan: Extubation in OR  Informed Consent: I have reviewed the patients History and Physical, chart, labs and discussed the procedure including the risks, benefits and alternatives for the proposed anesthesia with the patient or authorized representative who has indicated his/her understanding and acceptance.   Dental advisory given  Plan Discussed with: CRNA  Anesthesia Plan Comments:         Anesthesia Quick Evaluation

## 2018-02-08 NOTE — H&P (Signed)
Rita Roberts  Location: [facility] Surgery Patient #: [id number] DOB: May 07, 1956 Undefined / Language: Cleophus Molt / Race: Black or African American Female  History of Present Illness   The patient is a 62 year old female who presents with a complaint of left breast cancer.  The PCP is Dr. Sophronia Simas  The patient was referred by Dr. Azalee Course  The pateint is at the [facility] - Oncology is Drs. Magrinat and Kinard  She comes by herself.  Her last mammogram was about 13 years ago. She felt nothing abnormal in her breast. She's had no family history of breast cancer. She is planning a trip to [place] from 3/28 - 01/30/2018 - and she does not want the breast cacner to intefere with this.  Mammograms: [facility] - 11/17/2017 - 1.4 cm lobulated partially solid mass in the left UOQ and a left UOQ 6 mm irregular mass Biopsy: Left breast biopsy on 11/17/2017 ([id number]) #1. IDC, ER - 100%, PR - 100%, Ki67 - 5%, Her2 - Negative, #2. DCIS. 11/28/2017 ([id number]) - 1. left breast - DCIS, 2. DCIS Family history of breast or ovarian cancer: No On hormone therapy: No  I discussed the options for breast cancer treatment with the patient. The patient is at the [facility], which includes medical oncology and radiation oncology. I discussed the surgical options of lumpectomy vs. mastectomy. If mastectomy, there is the possibility of reconstruction. I discussed the options of lymph node biopsy. The treatment plan depends on the pathologic staging of the tumor and the patient's personal wishes. The risks of surgery include, but are not limited to, bleeding, infection, the need for further surgery, and nerve injury. The patient has been given literature on the treatment of breast cancer.  Plan: 1) MRI, 2) Plastic surgery consultation (sees Dr. Iran Planas on 12/02/2017), 3) left mastectomy and left axillary sentinel lymph node biopsy (consider  reconstruction, but she is not excited about it today)  Past Medical History: 1. Doesn't see physicians very often 2. She sees Dr. Collene Mares for a colonoscopy in March. She's never had one. she may delay the colonoscopy for a couple of months.  Social History: Divorced. Lives by herself. 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 36 yo in [place]. She reminded me that she has a trip planned for [place] on January 26, 2018  Physical Exam  General: WN AA [place] and generally healthy appearing. Skin: Inspection and palpation of the skin unremarkable.  Eyes: Conjunctivae white, pupils equal. Face, ears, nose, mouth, and throat: Face - normal. Normal ears and nose. Lips and teeth normal.  Neck: Supple. No mass. Trachea midline. No thyroid mass.  Lymph Nodes: No supraclavicular or cervical adenopathy. No axillary adenopathy.  Lungs: Normal respiratory effort. Clear to auscultation and symmetric breath sounds. Cardiovascular: Regular rate and rythm. Normal auscultation of the heart. No murmur or rub.  Breast - Right - no mass or nodule Left - bruise in the medial left breast. There may be some fullness in this area, but I can't feel a masss.  Abdomen: Soft. No mass. Liver and spleen not palpable. No tenderness. No hernia. Normal bowel sounds.  Rectal: Not done.  Musculoskeletal/extremities: Normal gait. Good strength and ROM in upper and lower extremities.   Neurologic: Grossly intact to motor and sensory function.   Psychiatric: Has normal mood and affect. Judgement and insight appear normal.   Assessment & Plan  1.  MALIGNANT  NEOPLASM OF LEFT BREAST, STAGE 1, ESTROGEN RECEPTOR POSITIVE (C50.912)  Story: Left breast biopsy on 11/17/2017 ([id number]) #1. IDC, ER - 100%, PR - 100%, Ki67 - 5%, Her2 - Negative, #2. DCIS.  Left breast biopsy - 11/28/2017 ([id number]) - 1. left breast - DCIS, 2.  DCIS   Oncology - Magrinat/Kinard  Plan:   1) MRI (to check other breast because of multiple tumors in the left) Addendum Note(Anusha Claus H. Lasaro Primm MD; 12/08/2017 2:25 PM) MRI on 12/07/2017 shows no lesions in the right breast.   2)  Message from Dr. Iran Planas on 12/02/2016 - the patient does not was reconstruction.   3) Left mastectomy and left axillary sentinel lymph node biopsy    4)  Addendum Note(Tracye Szuch H. Journe Hallmark MD; 12/25/2017 4:12 PM) Juluis Rainier, spoke to patient today and since her surgery was not until 4/11 because she is going on vacation we started her on anastrozole. Thanks,. Blima Dessert, MD, [facility] Pager: [phone] Office phone:  [phone]

## 2018-02-09 ENCOUNTER — Observation Stay (HOSPITAL_BASED_OUTPATIENT_CLINIC_OR_DEPARTMENT_OTHER)
Admission: RE | Admit: 2018-02-09 | Discharge: 2018-02-10 | Disposition: A | Discharge status: Home / Self Care | Payer: BC Managed Care – PPO | Source: Ambulatory Visit | Attending: Surgery | Admitting: Surgery

## 2018-02-09 ENCOUNTER — Ambulatory Visit (HOSPITAL_COMMUNITY)
Admission: RE | Admit: 2018-02-09 | Discharge: 2018-02-09 | Disposition: A | Discharge status: Home / Self Care | Payer: BC Managed Care – PPO | Source: Ambulatory Visit | Attending: Surgery | Admitting: Surgery

## 2018-02-09 ENCOUNTER — Other Ambulatory Visit: Payer: Self-pay

## 2018-02-09 ENCOUNTER — Ambulatory Visit (HOSPITAL_COMMUNITY): Payer: BC Managed Care – PPO | Admitting: Anesthesiology

## 2018-02-09 ENCOUNTER — Encounter (HOSPITAL_COMMUNITY): Payer: Self-pay | Admitting: *Deleted

## 2018-02-09 ENCOUNTER — Encounter (HOSPITAL_COMMUNITY)
Admission: RE | Disposition: A | Discharge status: Home / Self Care | Payer: Self-pay | Source: Ambulatory Visit | Attending: Surgery

## 2018-02-09 ENCOUNTER — Ambulatory Visit (HOSPITAL_COMMUNITY): Payer: BC Managed Care – PPO | Admitting: Emergency Medicine

## 2018-02-09 DIAGNOSIS — Z170 Estrogen receptor positive status [ER+]: Secondary | ICD-10-CM | POA: Insufficient documentation

## 2018-02-09 DIAGNOSIS — C50212 Malignant neoplasm of upper-inner quadrant of left female breast: Secondary | ICD-10-CM | POA: Diagnosis not present

## 2018-02-09 DIAGNOSIS — C50912 Malignant neoplasm of unspecified site of left female breast: Secondary | ICD-10-CM

## 2018-02-09 DIAGNOSIS — Z87891 Personal history of nicotine dependence: Secondary | ICD-10-CM | POA: Diagnosis not present

## 2018-02-09 HISTORY — PX: MASTECTOMY W/ SENTINEL NODE BIOPSY: SHX2001

## 2018-02-09 HISTORY — DX: Pneumonia, unspecified organism: J18.9

## 2018-02-09 HISTORY — DX: Family history of other specified conditions: Z84.89

## 2018-02-09 HISTORY — DX: Malignant neoplasm of unspecified site of left female breast: C50.912

## 2018-02-09 HISTORY — DX: Gastro-esophageal reflux disease without esophagitis: K21.9

## 2018-02-09 SURGERY — MASTECTOMY WITH SENTINEL LYMPH NODE BIOPSY
Anesthesia: General | Site: Breast | Laterality: Left

## 2018-02-09 MED ORDER — METHYLENE BLUE 0.5 % INJ SOLN
INTRAVENOUS | Status: AC
  Filled 2018-02-09: qty 10

## 2018-02-09 MED ORDER — MIDAZOLAM HCL 2 MG/2ML IJ SOLN
INTRAMUSCULAR | Status: AC
  Filled 2018-02-09: qty 2

## 2018-02-09 MED ORDER — CEFAZOLIN SODIUM-DEXTROSE 2-4 GM/100ML-% IV SOLN
2.0000 g | INTRAVENOUS | Status: AC
  Administered 2018-02-09: 2 g via INTRAVENOUS
  Filled 2018-02-09: qty 100

## 2018-02-09 MED ORDER — PHENYLEPHRINE HCL 10 MG/ML IJ SOLN
INTRAVENOUS | Status: DC | PRN
  Administered 2018-02-09: 40 ug/min via INTRAVENOUS

## 2018-02-09 MED ORDER — FENTANYL CITRATE (PF) 250 MCG/5ML IJ SOLN
INTRAMUSCULAR | Status: AC
  Filled 2018-02-09: qty 5

## 2018-02-09 MED ORDER — ACETAMINOPHEN 500 MG PO TABS
1000.0000 mg | ORAL_TABLET | ORAL | Status: AC
  Administered 2018-02-09: 1000 mg via ORAL
  Filled 2018-02-09: qty 2

## 2018-02-09 MED ORDER — 0.9 % SODIUM CHLORIDE (POUR BTL) OPTIME
TOPICAL | Status: DC | PRN
  Administered 2018-02-09: 1000 mL

## 2018-02-09 MED ORDER — MORPHINE SULFATE (PF) 4 MG/ML IV SOLN: 1.0000 mg | INTRAVENOUS | Status: DC | PRN

## 2018-02-09 MED ORDER — PROPOFOL 10 MG/ML IV BOLUS
INTRAVENOUS | Status: DC | PRN
  Administered 2018-02-09: 50 mg via INTRAVENOUS
  Administered 2018-02-09: 200 mg via INTRAVENOUS

## 2018-02-09 MED ORDER — BUPIVACAINE-EPINEPHRINE (PF) 0.5% -1:200000 IJ SOLN
INTRAMUSCULAR | Status: DC | PRN
  Administered 2018-02-09: 30 mL

## 2018-02-09 MED ORDER — LACTATED RINGERS IV SOLN
INTRAVENOUS | Status: DC | PRN
  Administered 2018-02-09 (×2): via INTRAVENOUS

## 2018-02-09 MED ORDER — CHLORHEXIDINE GLUCONATE CLOTH 2 % EX PADS: 6.0000 | MEDICATED_PAD | Freq: Once | CUTANEOUS | Status: DC

## 2018-02-09 MED ORDER — KCL IN DEXTROSE-NACL 20-5-0.45 MEQ/L-%-% IV SOLN
INTRAVENOUS | Status: DC
  Administered 2018-02-09 – 2018-02-10 (×2): via INTRAVENOUS
  Filled 2018-02-09 (×2): qty 1000

## 2018-02-09 MED ORDER — LIDOCAINE HCL (CARDIAC) 20 MG/ML IV SOLN
INTRAVENOUS | Status: DC | PRN
  Administered 2018-02-09: 60 mg via INTRAVENOUS

## 2018-02-09 MED ORDER — HEPARIN SODIUM (PORCINE) 5000 UNIT/ML IJ SOLN
5000.0000 [IU] | Freq: Three times a day (TID) | INTRAMUSCULAR | Status: DC
  Administered 2018-02-09 (×2): 5000 [IU] via SUBCUTANEOUS
  Filled 2018-02-09 (×2): qty 1

## 2018-02-09 MED ORDER — GABAPENTIN 300 MG PO CAPS
300.0000 mg | ORAL_CAPSULE | ORAL | Status: AC
  Administered 2018-02-09: 300 mg via ORAL
  Filled 2018-02-09: qty 1

## 2018-02-09 MED ORDER — DEXAMETHASONE SODIUM PHOSPHATE 10 MG/ML IJ SOLN
INTRAMUSCULAR | Status: AC
  Filled 2018-02-09: qty 1

## 2018-02-09 MED ORDER — TECHNETIUM TC 99M SULFUR COLLOID FILTERED
1.0000 | Freq: Once | INTRAVENOUS | Status: AC | PRN
  Administered 2018-02-09: 1 via INTRADERMAL

## 2018-02-09 MED ORDER — PROPOFOL 10 MG/ML IV BOLUS
INTRAVENOUS | Status: AC
  Filled 2018-02-09: qty 20

## 2018-02-09 MED ORDER — ACETAMINOPHEN 325 MG PO TABS
650.0000 mg | ORAL_TABLET | Freq: Three times a day (TID) | ORAL | Status: DC
  Administered 2018-02-09 – 2018-02-10 (×3): 650 mg via ORAL
  Filled 2018-02-09 (×3): qty 2

## 2018-02-09 MED ORDER — LIDOCAINE 2% (20 MG/ML) 5 ML SYRINGE
INTRAMUSCULAR | Status: AC
  Filled 2018-02-09: qty 5

## 2018-02-09 MED ORDER — DEXAMETHASONE SODIUM PHOSPHATE 10 MG/ML IJ SOLN
INTRAMUSCULAR | Status: DC | PRN
  Administered 2018-02-09: 10 mg via INTRAVENOUS

## 2018-02-09 MED ORDER — HYDROCODONE-ACETAMINOPHEN 5-325 MG PO TABS: 1.0000 | ORAL_TABLET | ORAL | Status: DC | PRN

## 2018-02-09 MED ORDER — EPHEDRINE SULFATE 50 MG/ML IJ SOLN
INTRAMUSCULAR | Status: DC | PRN
  Administered 2018-02-09: 10 mg via INTRAVENOUS
  Administered 2018-02-09: 5 mg via INTRAVENOUS

## 2018-02-09 MED ORDER — ONDANSETRON HCL 4 MG/2ML IJ SOLN
INTRAMUSCULAR | Status: DC | PRN
Start: 2018-02-09 — End: 2018-02-09
  Administered 2018-02-09: 4 mg via INTRAVENOUS

## 2018-02-09 MED ORDER — ONDANSETRON HCL 4 MG/2ML IJ SOLN: 4.0000 mg | Freq: Four times a day (QID) | INTRAMUSCULAR | Status: DC | PRN

## 2018-02-09 MED ORDER — FENTANYL CITRATE (PF) 100 MCG/2ML IJ SOLN
INTRAMUSCULAR | Status: DC | PRN
  Administered 2018-02-09: 50 ug via INTRAVENOUS
  Administered 2018-02-09: 25 ug via INTRAVENOUS
  Administered 2018-02-09: 50 ug via INTRAVENOUS

## 2018-02-09 MED ORDER — ONDANSETRON HCL 4 MG/2ML IJ SOLN
INTRAMUSCULAR | Status: AC
  Filled 2018-02-09: qty 2

## 2018-02-09 MED ORDER — MIDAZOLAM HCL 5 MG/5ML IJ SOLN
INTRAMUSCULAR | Status: DC | PRN
  Administered 2018-02-09: 2 mg via INTRAVENOUS

## 2018-02-09 MED ORDER — ONDANSETRON 4 MG PO TBDP: 4.0000 mg | ORAL_TABLET | Freq: Four times a day (QID) | ORAL | Status: DC | PRN

## 2018-02-09 MED ORDER — HYDROMORPHONE HCL 1 MG/ML IJ SOLN: 0.2500 mg | INTRAMUSCULAR | Status: DC | PRN

## 2018-02-09 SURGICAL SUPPLY — 60 items
APPLIER CLIP 9.375 MED OPEN (MISCELLANEOUS) ×3
ATCH SMKEVC FLXB CAUT HNDSWH (FILTER) ×1 IMPLANT
BINDER BREAST LRG (GAUZE/BANDAGES/DRESSINGS) IMPLANT
BINDER BREAST XLRG (GAUZE/BANDAGES/DRESSINGS) ×3 IMPLANT
BIOPATCH RED 1 DISK 7.0 (GAUZE/BANDAGES/DRESSINGS) ×2 IMPLANT
BIOPATCH RED 1IN DISK 7.0MM (GAUZE/BANDAGES/DRESSINGS) ×1
CANISTER SUCT 3000ML PPV (MISCELLANEOUS) ×3 IMPLANT
CHLORAPREP W/TINT 26ML (MISCELLANEOUS) ×3 IMPLANT
CLIP APPLIE 9.375 MED OPEN (MISCELLANEOUS) ×1 IMPLANT
CONT SPEC 4OZ CLIKSEAL STRL BL (MISCELLANEOUS) ×3 IMPLANT
COVER PROBE W GEL 5X96 (DRAPES) ×3 IMPLANT
COVER SURGICAL LIGHT HANDLE (MISCELLANEOUS) ×3 IMPLANT
DERMABOND ADVANCED (GAUZE/BANDAGES/DRESSINGS) ×2
DERMABOND ADVANCED .7 DNX12 (GAUZE/BANDAGES/DRESSINGS) ×1 IMPLANT
DRAIN CHANNEL 19F RND (DRAIN) ×3 IMPLANT
DRAPE CHEST BREAST 15X10 FENES (DRAPES) ×3 IMPLANT
DRAPE HALF SHEET 40X57 (DRAPES) ×3 IMPLANT
DRSG PAD ABDOMINAL 8X10 ST (GAUZE/BANDAGES/DRESSINGS) ×6 IMPLANT
DRSG TEGADERM 4X4.75 (GAUZE/BANDAGES/DRESSINGS) ×3 IMPLANT
ELECT CAUTERY BLADE 6.4 (BLADE) ×3 IMPLANT
ELECT REM PT RETURN 9FT ADLT (ELECTROSURGICAL) ×6
ELECTRODE REM PT RTRN 9FT ADLT (ELECTROSURGICAL) ×2 IMPLANT
EVACUATOR SILICONE 100CC (DRAIN) ×3 IMPLANT
EVACUATOR SMOKE ACCUVAC VALLEY (FILTER) ×2
GAUZE SPONGE 4X4 12PLY STRL (GAUZE/BANDAGES/DRESSINGS) ×3 IMPLANT
GAUZE SPONGE 4X4 12PLY STRL LF (GAUZE/BANDAGES/DRESSINGS) ×3 IMPLANT
GLOVE BIOGEL PI IND STRL 7.0 (GLOVE) ×1 IMPLANT
GLOVE BIOGEL PI INDICATOR 7.0 (GLOVE) ×2
GLOVE SS BIOGEL STRL SZ 7 (GLOVE) ×1 IMPLANT
GLOVE SUPERSENSE BIOGEL SZ 7 (GLOVE) ×2
GLOVE SURG SIGNA 7.5 PF LTX (GLOVE) ×3 IMPLANT
GOWN STRL REUS W/ TWL LRG LVL3 (GOWN DISPOSABLE) ×1 IMPLANT
GOWN STRL REUS W/ TWL XL LVL3 (GOWN DISPOSABLE) ×1 IMPLANT
GOWN STRL REUS W/TWL LRG LVL3 (GOWN DISPOSABLE) ×2
GOWN STRL REUS W/TWL XL LVL3 (GOWN DISPOSABLE) ×2
ILLUMINATOR WAVEGUIDE N/F (MISCELLANEOUS) IMPLANT
KIT BASIN OR (CUSTOM PROCEDURE TRAY) ×3 IMPLANT
KIT TURNOVER KIT B (KITS) ×3 IMPLANT
LIGHT WAVEGUIDE WIDE FLAT (MISCELLANEOUS) IMPLANT
MARKER SKIN DUAL TIP RULER LAB (MISCELLANEOUS) ×3 IMPLANT
NEEDLE 18GX1X1/2 (RX/OR ONLY) (NEEDLE) IMPLANT
NEEDLE FILTER BLUNT 18X 1/2SAF (NEEDLE)
NEEDLE FILTER BLUNT 18X1 1/2 (NEEDLE) IMPLANT
NEEDLE HYPO 25GX1X1/2 BEV (NEEDLE) IMPLANT
NS IRRIG 1000ML POUR BTL (IV SOLUTION) ×3 IMPLANT
PACK GENERAL/GYN (CUSTOM PROCEDURE TRAY) ×3 IMPLANT
PAD ABD 8X10 STRL (GAUZE/BANDAGES/DRESSINGS) ×3 IMPLANT
PAD ARMBOARD 7.5X6 YLW CONV (MISCELLANEOUS) ×3 IMPLANT
PREFILTER EVAC NS 1 1/3-3/8IN (MISCELLANEOUS) ×3 IMPLANT
SPECIMEN JAR X LARGE (MISCELLANEOUS) ×3 IMPLANT
SPONGE LAP 18X18 X RAY DECT (DISPOSABLE) ×3 IMPLANT
SUT ETHILON 2 0 FS 18 (SUTURE) ×6 IMPLANT
SUT MNCRL AB 4-0 PS2 18 (SUTURE) ×3 IMPLANT
SUT SILK 2 0 PERMA HAND 18 BK (SUTURE) ×3 IMPLANT
SUT VIC AB 3-0 SH 18 (SUTURE) ×6 IMPLANT
SYR CONTROL 10ML LL (SYRINGE) IMPLANT
TOWEL OR 17X24 6PK STRL BLUE (TOWEL DISPOSABLE) ×3 IMPLANT
TOWEL OR 17X26 10 PK STRL BLUE (TOWEL DISPOSABLE) ×3 IMPLANT
TUBE CONNECTING 12'X1/4 (SUCTIONS) ×1
TUBE CONNECTING 12X1/4 (SUCTIONS) ×2 IMPLANT

## 2018-02-09 NOTE — Interval H&P Note (Signed)
History and Physical Interval Note:  02/09/2018 7:23 AM  Rita Roberts  has presented today for surgery, with the diagnosis of LEFT BREAST CANCER  The various methods of treatment have been discussed with the patient and family.  Son at bedside.  After consideration of risks, benefits and other options for treatment, the patient has consented to  Procedure(s): LEFT MASTECTOMY WITH LEFT AXILLARY SENTINEL LYMPH NODE BIOPSY (Left) as a surgical intervention .  The patient's history has been reviewed, patient examined, no change in status, stable for surgery.  I have reviewed the patient's chart and labs.  Questions were answered to the patient's satisfaction.     Shann Medal

## 2018-02-09 NOTE — Progress Notes (Signed)
Patient arrived to 6N21 A&Ox4, VSS, IV intact.  Noted to have incision with skin glue closure and 2 jp drains present in left breast area.  Patient denies pain at this time.  Son at bedside.  Patient and family oriented to room and equipment.  Will continue to monitor.

## 2018-02-09 NOTE — Anesthesia Postprocedure Evaluation (Signed)
Anesthesia Post Note  Patient: Rita Roberts  Procedure(s) Performed: LEFT MASTECTOMY WITH LEFT AXILLARY SENTINEL LYMPH NODE BIOPSY (Left Breast)     Patient location during evaluation: PACU Anesthesia Type: General and Regional Level of consciousness: awake and alert Pain management: pain level controlled Vital Signs Assessment: post-procedure vital signs reviewed and stable Respiratory status: spontaneous breathing, nonlabored ventilation and respiratory function stable Cardiovascular status: blood pressure returned to baseline and stable Postop Assessment: no apparent nausea or vomiting Anesthetic complications: no    Last Vitals:  Vitals:   02/09/18 1045 02/09/18 1112  BP: 127/89 131/88  Pulse: 70 75  Resp: 20 18  Temp: 36.8 C 36.8 C  SpO2: 95% 100%    Last Pain:  Vitals:   02/09/18 1112  TempSrc: Oral  PainSc:                  June Vacha,W. EDMOND

## 2018-02-09 NOTE — Op Note (Signed)
02/09/2018  9:27 AM  PATIENT:  Rita Roberts, 62 y.o., female, MRN: [id number]  PREOP DIAGNOSIS:  LEFT BREAST CANCER  POSTOP DIAGNOSIS:   Left Breast Cancer, 10 o'clock position (T1, N0)  PROCEDURE:   Procedure(s):  LEFT MASTECTOMY WITH LEFT AXILLARY SENTINEL LYMPH NODE BIOPSY , deep sentinel lymph node biopsy  SURGEON:   Alphonsa Overall, M.D.  ASSISTANT:   None  ANESTHESIA:   general  Anesthesiologist: Roderic Palau, MD CRNA: Laretta Alstrom, CRNA; Orlie Dakin, CRNA  General  ASA:  2  EBL:  200  ml  BLOOD ADMINISTERED: none  DRAINS: 2 19 F Blake drains  LOCAL MEDICATIONS USED:   Left pectoral block  SPECIMEN:   Left breast (suture lateral), left mastectomy superior skin (long suture lateral, short suture cranial), left axillary sentinel lymph node (counts 220, background 5)  COUNTS CORRECT:  YES  INDICATIONS FOR PROCEDURE:  Rita Roberts is a 62 y.o. (DOB: 12-27-1955) AA female whose primary care physician is Dillard, Gwynneth Munson, MD and comes for left mastectomy with left axillary sentinel lymph node biopsy.   She was seen at the [facility] in January 2019 for an IDC of her left breast.  She was seen with Drs. Magrinat and Kinard.  She wanted to wait to have her surgery because of trip to [place] in March.  She met Dr. Iran Planas, but was not interested in breast reconstruction.   The indications and risks of the surgery were explained to the patient.  The risks include, but are not limited to, infection, bleeding, and nerve injury.   In the holding area, her left areola was injected with 1 millicurie of Technitium Sulfur Colloid.  OPERATIVE NOTE;  The patient was taken to OR room # 17 at [facility] where she underwent a general anesthesia  supervised by Anesthesiologist: Roderic Palau, MD CRNA: Laretta Alstrom, CRNA; Orlie Dakin, CRNA. Her left breast and axilla were prepped with ChloraPrep and sterilely draped.    A time-out and the surgical check list was  reviewed.    I made an elliptical incision including the areola in the left breast.  I developed skin flaps medially to the lateral edge of the sternum, inferiorly to the investing fascia of the rectus abdominus muscle, laterally to the anterior edge of the latissimus dorsi muscle, and superiorly to about 2 finger breaths below the clavicle.  The breast was reflected off the pectoralis muscle from medial to lateral.  The lateral attachments in the left axilla were divided and the breast removed.  A long suture was placed on the lateral aspect of the breast.   I dissected into the left axilla and found a deep sentinel lymph node.  The node had counts of 220 with a background count of 5.  This was sent as a separate specimen.    I excised some superior skin which was excessive.  I marked this skin excision with a long suture laterally and a short suture cranially.   I brought out 2 81 F Blake drains below the inferior flaps.  These were sewn in place with 2-0 Nylons.  I irrigated the wound with 2,000 cc of fluid.   The skin was closed with interrupted 3-0 Vicryl sutures and the skin was closed with a 4-0 Monocryl.  The wound was painted with Dermabond.    A pressure dressing was placed on the wound and the chest wrapped with a breast binder.  Her needle and sponge count were correct  at the end of the case.   She was transferred to the recovery room in good condition.  Alphonsa Overall, MD, [facility] Surgery Pager: [phone] Office phone:  [phone]

## 2018-02-09 NOTE — Anesthesia Procedure Notes (Signed)
Anesthesia Regional Block: Pectoralis block   Pre-Anesthetic Checklist: ,, timeout performed, Correct Patient, Correct Site, Correct Laterality, Correct Procedure, Correct Position, site marked, Risks and benefits discussed, pre-op evaluation,  At surgeon's request and post-op pain management  Laterality: Left  Prep: Maximum Sterile Barrier Precautions used, chloraprep       Needles:  Injection technique: Single-shot  Needle Type: Echogenic Stimulator Needle     Needle Length: 9cm  Needle Gauge: 21     Additional Needles:   Procedures:,,,, ultrasound used (permanent image in chart),,,,  Narrative:  Start time: 02/09/2018 7:00 AM End time: 02/09/2018 7:10 AM Injection made incrementally with aspirations every 5 mL. Anesthesiologist: Roderic Palau, MD  Additional Notes: 2% Lidocaine skin wheel.

## 2018-02-09 NOTE — Progress Notes (Signed)
Spoke with Dr. Ola Spurr about blood pressure, stated that it will be fine

## 2018-02-09 NOTE — Transfer of Care (Signed)
Immediate Anesthesia Transfer of Care Note  Patient: Rita Roberts  Procedure(s) Performed: LEFT MASTECTOMY WITH LEFT AXILLARY SENTINEL LYMPH NODE BIOPSY (Left Breast)  Patient Location: PACU  Anesthesia Type:General  Level of Consciousness: awake, alert , oriented and patient cooperative  Airway & Oxygen Therapy: Patient Spontanous Breathing and Patient connected to nasal cannula oxygen  Post-op Assessment: Report given to RN and Post -op Vital signs reviewed and stable  Post vital signs: Reviewed and stable  Last Vitals:  Vitals Value Taken Time  BP 135/89 02/09/2018  9:38 AM  Temp    Pulse    Resp 10 02/09/2018  9:40 AM  SpO2    Vitals shown include unvalidated device data.  Last Pain:  Vitals:   02/09/18 0553  TempSrc:   PainSc: 0-No pain      Patients Stated Pain Goal: 3 (85/90/93 1121)  Complications: No apparent anesthesia complications

## 2018-02-10 ENCOUNTER — Encounter (HOSPITAL_COMMUNITY): Payer: Self-pay | Admitting: Surgery

## 2018-02-10 DIAGNOSIS — C50212 Malignant neoplasm of upper-inner quadrant of left female breast: Secondary | ICD-10-CM | POA: Diagnosis not present

## 2018-02-10 MED ORDER — HYDROCODONE-ACETAMINOPHEN 5-325 MG PO TABS: 1.0000 | ORAL_TABLET | Freq: Four times a day (QID) | ORAL | 0 refills | Status: DC | PRN

## 2018-02-10 NOTE — Progress Notes (Signed)
Rita Roberts to be D/C'd  per MD order. Discussed with the patient and all questions fully answered.  VSS, Skin clean, dry and intact without evidence of skin break down, no evidence of skin tears noted.  IV catheter discontinued intact. Site without signs and symptoms of complications. Dressing and pressure applied.  An After Visit Summary was printed and given to the patient. Patient received prescription.  D/c education completed with patient/family including follow up instructions, medication list, d/c activities limitations if indicated, with other d/c instructions as indicated by MD - patient able to verbalize understanding, all questions fully answered.   Patient instructed to return to ED, call 911, or call MD for any changes in condition.   Patient D/C home via private auto.

## 2018-02-10 NOTE — Discharge Summary (Signed)
Physician Discharge Summary  Patient ID:  Rita Roberts  MRN: [id number]  DOB/AGE: November 12, 1955 62 y.o.  Admit date: 02/09/2018 Discharge date: 02/10/2018  Discharge Diagnoses:   Active Problems:   Breast cancer, stage 1, estrogen receptor positive, left (HCC)   Operation: Procedure(s): LEFT MASTECTOMY WITH LEFT AXILLARY SENTINEL LYMPH NODE BIOPSY on 02/09/2018 - [facility]  Discharged Condition: good  Hospital Course: [name] is an 62 y.o. female whose primary care physician is Crawford Givens, MD and who was admitted 02/09/2018 with a chief complaint of left breast cancer.   She was brought to the operating room on 02/09/2018 and underwent  LEFT MASTECTOMY WITH LEFT AXILLARY SENTINEL LYMPH NODE BIOPSY.   She is now one day post op and doing well. Her son is in the room with her.  The discharge instructions were reviewed with the patient.  Consults: None  Significant Diagnostic Studies: Results for orders placed or performed during the hospital encounter of 02/01/18  Hemoglobin  Result Value Ref Range   Hemoglobin 13.6 12.0 - 15.0 g/dL  Basic metabolic panel  Result Value Ref Range   Sodium 140 135 - 145 mmol/L   Potassium 3.6 3.5 - 5.1 mmol/L   Chloride 107 101 - 111 mmol/L   CO2 24 22 - 32 mmol/L   Glucose, Bld 91 65 - 99 mg/dL   BUN 10 6 - 20 mg/dL   Creatinine, Ser 0.79 0.44 - 1.00 mg/dL   Calcium 9.4 8.9 - 10.3 mg/dL   GFR calc non Af Amer >60 >60 mL/min   GFR calc Af Amer >60 >60 mL/min   Anion gap 9 5 - 15    Nm Sentinel Node Inj-no Rpt (breast)  Result Date: 02/09/2018 Sulfur colloid was injected by the nuclear medicine technologist for melanoma sentinel node.    Discharge Exam:  Vitals:   02/10/18 0139 02/10/18 0605  BP: 119/88 125/88  Pulse: 63 61  Resp: 19 19  Temp: 97.9 F (36.6 C) (!) 97.5 F (36.4 C)  SpO2: 97% 100%    General: WN AA F who is alert and generally healthy appearing.  Lungs: Clear to auscultation and symmetric breath  sounds. Heart:  RRR. No murmur or rub. Left chest:  Mastectomy wound looks good.     Drains 1/2 - 75/90 cc  Discharge Medications:   Allergies as of 02/10/2018   No Known Allergies     Medication List    TAKE these medications   anastrozole 1 MG tablet Commonly known as:  ARIMIDEX Take 1 tablet (1 mg total) by mouth daily.   FLAX SEED OIL PO Take 1 capsule by mouth daily.   HYDROcodone-acetaminophen 5-325 MG tablet Commonly known as:  NORCO/VICODIN Take 1-2 tablets by mouth every 6 (six) hours as needed for moderate pain.   ibuprofen 200 MG tablet Commonly known as:  ADVIL,MOTRIN Take 400 mg by mouth daily as needed for headache or moderate pain.   LAXATIVE PO Take 1 tablet by mouth daily as needed (constipation).   MENOPAUSE FORMULA Tabs Take 1 tablet by mouth daily.       Disposition: Discharge disposition: 01-Home or Self Care       Discharge Instructions    Diet - low sodium heart healthy   Complete by:  As directed    Increase activity slowly   Complete by:  As directed          Signed: Alphonsa Overall, M.D., [facility] Office:  [phone]  02/10/2018, 12:12 PM

## 2018-02-10 NOTE — Progress Notes (Signed)
Patient successfully did a return demonstration in emptying her 2 JP drains.

## 2018-02-10 NOTE — Discharge Instructions (Signed)
[facility] - DISCHARGE INSTRUCTIONS TO PATIENT  Activity:  Driving - May drive in 2 or 3 days, if doing well and on no pain meds   Lifting - No lifitng more than 15 pounds for 10 days  Wound Care:   Leave the bandage on until Sunday (2 days), then you may remove and shower  Diet:  As tolerated.  Follow up appointment:  Call Dr. Pollie Friar office [facility]) at [phone] for an appointment in 5 to 10 days.  Medications and dosages:  Resume your home medications.  You have a prescription for:  Vicodin  Call Dr. Lucia Gaskins or his office  [phone]) if you have:  Temperature greater than 100.4,  Persistent nausea and vomiting,  Severe uncontrolled pain,  Redness, tenderness, or signs of infection (pain, swelling, redness, odor or green/yellow discharge around the site),  Difficulty breathing, headache or visual disturbances,  Any other questions or concerns you may have after discharge.  In an emergency, call 911 or go to an Emergency Department at a nearby hospital.

## 2018-02-14 ENCOUNTER — Telehealth: Payer: Self-pay | Admitting: *Deleted

## 2018-02-14 NOTE — Telephone Encounter (Signed)
Received order for oncotype testing. Requisition faxed to pathology. Received by Keisha 

## 2018-03-01 ENCOUNTER — Telehealth: Payer: Self-pay | Admitting: *Deleted

## 2018-03-01 DIAGNOSIS — C50412 Malignant neoplasm of upper-outer quadrant of left female breast: Secondary | ICD-10-CM

## 2018-03-01 DIAGNOSIS — Z170 Estrogen receptor positive status [ER+]: Principal | ICD-10-CM

## 2018-03-01 NOTE — Telephone Encounter (Signed)
Received oncotype results of 0.  Left message for a return phone call to inform patient.  Referral placed for Dr. Sondra Come.

## 2018-03-02 ENCOUNTER — Encounter (HOSPITAL_COMMUNITY): Payer: Self-pay | Admitting: Oncology

## 2018-03-06 ENCOUNTER — Inpatient Hospital Stay: Payer: BC Managed Care – PPO | Attending: Oncology | Admitting: Oncology

## 2018-03-06 VITALS — BP 131/91 | HR 72 | Temp 99.1°F | Resp 18 | Ht 63.0 in | Wt 188.1 lb

## 2018-03-06 DIAGNOSIS — Z79899 Other long term (current) drug therapy: Secondary | ICD-10-CM | POA: Insufficient documentation

## 2018-03-06 DIAGNOSIS — C50412 Malignant neoplasm of upper-outer quadrant of left female breast: Secondary | ICD-10-CM | POA: Diagnosis not present

## 2018-03-06 DIAGNOSIS — D0512 Intraductal carcinoma in situ of left breast: Secondary | ICD-10-CM | POA: Insufficient documentation

## 2018-03-06 DIAGNOSIS — Z170 Estrogen receptor positive status [ER+]: Secondary | ICD-10-CM | POA: Diagnosis not present

## 2018-03-06 DIAGNOSIS — Z780 Asymptomatic menopausal state: Secondary | ICD-10-CM | POA: Diagnosis not present

## 2018-03-06 DIAGNOSIS — Z87891 Personal history of nicotine dependence: Secondary | ICD-10-CM | POA: Insufficient documentation

## 2018-03-06 DIAGNOSIS — K219 Gastro-esophageal reflux disease without esophagitis: Secondary | ICD-10-CM | POA: Insufficient documentation

## 2018-03-06 DIAGNOSIS — Z9012 Acquired absence of left breast and nipple: Secondary | ICD-10-CM | POA: Diagnosis not present

## 2018-03-06 DIAGNOSIS — Z79811 Long term (current) use of aromatase inhibitors: Secondary | ICD-10-CM | POA: Insufficient documentation

## 2018-03-06 DIAGNOSIS — Z801 Family history of malignant neoplasm of trachea, bronchus and lung: Secondary | ICD-10-CM | POA: Diagnosis not present

## 2018-03-06 DIAGNOSIS — C50912 Malignant neoplasm of unspecified site of left female breast: Secondary | ICD-10-CM

## 2018-03-06 NOTE — Progress Notes (Signed)
[place]  [phone] [phone]     ID: Rita Roberts DOB: 26-Apr-1956  MR#: [id number]  VOJ#:[id number]  Patient Care Team: Crawford Givens, MD as PCP - General (Obstetrics and Gynecology) Jordanne Elsbury, Virgie Dad, MD as Consulting Physician (Oncology) Juanita Craver, MD as Consulting Physician (Gastroenterology) Gery Pray, MD as Consulting Physician (Radiation Oncology) Alphonsa Overall, MD as Consulting Physician (General Surgery) OTHER MD:  CHIEF COMPLAINT: Estrogen receptor positive breast cancer  CURRENT TREATMENT: Anastrozole  HISTORY OF CURRENT ILLNESS: From the original intake note:  Rita Roberts had routine screening mammography on 11/09/2017 showing a possible abnormality in the left breast. She underwent unilateral diagnostic mammography with tomography and left breast ultrasonography at [facility] on 11/14/2017 showing: breast density category C. At the 2 o'clock left upper outer position of the breast, there is a 1.5 cm lobulated mass that is 2 cm from the nipple. At the 1 o'clock left upper outer position of the breast, there is a 0.8 cm mass that is 8 cm from the nipple. There are several areas of clustered calcifications thoughout the upper hemisphere of the breast.   Ultrsound of the left breast and axilla at [facility] on 11/17/2017 showed no evidence of malignancy in the left axilla.   Accordingly on 11/17/2017 she proceeded to biopsy of the left breast masses in question. 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 Prognostic indicators for the invasive component was significant for: estrogen receptor, 100% positive and progesterone receptor, 100% positive, both with strong staining intensity. Proliferation marker Ki67 at 5%. HER2 not amplified with  ratio of HER2/CEP17 signals 1.33 and average HER2 copied per cell 2.60.  Biopsy of 2 additional areas in the breast in different quadrants also showed ductal carcinoma in situ.  In brief the patient had 4 left breast biopsies, 3 showing ductal carcinoma in situ and 1 showing invasive ductal carcinoma as noted above  The patient's subsequent history is as detailed below.  INTERVAL HISTORY: She returns today for follow up and treatment of her estrogen receptor positive breast cancer. Since her last visit, she underwent a left mastectomy with sentinel lymph node sampling ([id number]) on 02/09/2018 with pathology showing: Invasive ductal carcinoma grade I , spanning 1.1 cm with calcifications. Multifocal DCIS. Margins not involved. 2 Left axillary sentinel lymph nodes were excised and were negative for carcinoma.   An Oncotype was obtained on this tissue showing a recurrence score of 0 predicting a risk of recurrence outside the breast in the next 9 years of 3% if the patient's only systemic therapy is antiestrogen for 5 years.  It also predicts no benefit from chemotherapy   REVIEW OF SYSTEMS: The patient reports that she had no complications from her mastectomy. She had drains for about 1 week, and they were uncomfortable for her. She denies unusual headaches, visual changes, nausea, vomiting, or dizziness. There has been no unusual cough, phlegm production, or pleurisy. This been no change in bowel or bladder habits. She denies unexplained fatigue or unexplained weight loss, bleeding, rash, or fever. A detailed review of systems was otherwise stable.    PAST MEDICAL HISTORY: Past Medical History:  Diagnosis Date  . Anemia   . Arthritis    "probably in left shoulder" (02/09/2018)  . Breast cancer, left breast ([place]) 11/2017  . Family history of adverse  reaction to anesthesia    "my mom may have; not sure" (02/09/2018)  . GERD (gastroesophageal reflux disease)   . Heartburn   . Pneumonia 1960        PAST SURGICAL HISTORY: Past Surgical History:  Procedure Laterality Date  . BREAST BIOPSY Left 11/2017 X 4  . CESAREAN SECTION  1983  . COLONOSCOPY    . INCISION AND DRAINAGE ABSCESS ANAL  1990s  . MASTECTOMY COMPLETE / SIMPLE W/ SENTINEL NODE BIOPSY Left    LEFT MASTECTOMY WITH LEFT AXILLARY SENTINEL LYMPH NODE BIOPSY  . MASTECTOMY W/ SENTINEL NODE BIOPSY Left 02/09/2018   Procedure: LEFT MASTECTOMY WITH LEFT AXILLARY SENTINEL LYMPH NODE BIOPSY;  Surgeon: Alphonsa Overall, MD;  Location: [place];  Service: General;  Laterality: Left;   Boil Lancing  FAMILY HISTORY Family History  Problem Relation Age of Onset  . Lung cancer Father     She notes that her father passed away at age 76 from lung cancer and was not a smoker. Her mother passed away at age 57 from dementia. She has 3 brothers and 1 sister. She denies a family history of breast or ovarian cancer.   GYNECOLOGIC HISTORY:  No LMP recorded. Patient is postmenopausal. 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 At home is just herself. She is divorced. Her son, Brantlee Hinde is a Glass blower/designer in [place], [place]. She does not have any grandchildren. She belongs to [employer] in [place], [place]: not in place; at the 11/30/2017 visit the patient was given the appropriate documents to complete a Serita Kyle at her discretion   HEALTH MAINTENANCE: Social History   Tobacco Use  . Smoking status: Former Smoker    Packs/day: 0.12    Years: 15.00    Pack years: 1.80    Types: Cigarettes    Last attempt to quit: 1990    Years since quitting: 29.3  . Smokeless tobacco: Never Used  Substance Use Topics  . Alcohol use: Not Currently    Frequency: Never  . Drug use: Not Currently      Colonoscopy: scheduled in March 2019/ Dr. Collene Mares  PAP: 11/14/2017  Bone density: no  First mammogram: 1977   No Known Allergies  Current Outpatient Medications  Medication Sig Dispense Refill  . anastrozole (ARIMIDEX) 1 MG tablet Take 1 tablet (1 mg total) by mouth daily. 90 tablet 2  . Bisacodyl (LAXATIVE PO) Take 1 tablet by mouth daily as needed (constipation).    . Flaxseed, Linseed, (FLAX SEED OIL PO) Take 1 capsule by mouth daily.    Marland Kitchen HYDROcodone-acetaminophen (NORCO/VICODIN) 5-325 MG tablet Take 1-2 tablets by mouth every 6 (six) hours as needed for moderate pain. 20 tablet 0  . ibuprofen (ADVIL,MOTRIN) 200 MG tablet Take 400 mg by mouth daily as needed for headache or moderate pain.    . Nutritional Supplements (MENOPAUSE FORMULA) TABS Take 1 tablet by mouth daily.     No current facility-administered medications for this visit.     OBJECTIVE: Middle-aged African-American woman no apparent distress  Vitals:   03/06/18 1418  BP: (!) 131/91  Pulse: 72  Resp: 18  Temp: 99.1 F (37.3 C)  SpO2: 100%     Body mass index is 33.[place]  kg/m.   Wt Readings from Last 3 Encounters:  03/06/18 188 lb 1.6 oz (85.3 kg)  02/09/18 188 lb 15 oz (85.7 kg)  02/01/18 189 lb 7 oz (85.9 kg)      ECOG FS:1 - Symptomatic but completely ambulatory  Sclerae unicteric, EOMs intact Oropharynx clear and moist No cervical or supraclavicular adenopathy Lungs no rales or rhonchi Heart regular rate and rhythm Abd soft, nontender, positive bowel sounds MSK no focal spinal tenderness, no upper extremity lymphedema Neuro: nonfocal, well oriented, appropriate affect Breasts: The right breast is benign.  The left breast is undergone mastectomy.  The incision is healing nicely.  It is mildly irregular, but there is no dehiscence, erythema, or swelling.  Both axillae are benign.  LAB RESULTS:  CMP     Component Value Date/Time   NA 140 02/01/2018 1142   K 3.6 02/01/2018 1142   CL 107  02/01/2018 1142   CO2 24 02/01/2018 1142   GLUCOSE 91 02/01/2018 1142   BUN 10 02/01/2018 1142   CREATININE 0.79 02/01/2018 1142   CREATININE 0.98 11/30/2017 0839   CALCIUM 9.4 02/01/2018 1142   PROT 7.6 11/30/2017 0839   ALBUMIN 4.1 11/30/2017 0839   AST 16 11/30/2017 0839   ALT 11 11/30/2017 0839   ALKPHOS 59 11/30/2017 0839   BILITOT 1.1 11/30/2017 0839   GFRNONAA >60 02/01/2018 1142   GFRNONAA >60 11/30/2017 0839   GFRAA >60 02/01/2018 1142   GFRAA >60 11/30/2017 0839    No results found for: TOTALPROTELP, ALBUMINELP, A1GS, A2GS, BETS, BETA2SER, GAMS, MSPIKE, SPEI  No results found for: KPAFRELGTCHN, LAMBDASER, [facility]  Lab Results  Component Value Date   WBC 4.2 11/30/2017   NEUTROABS 2.3 11/30/2017   HGB 13.6 02/01/2018   HCT 41.5 11/30/2017   MCV 87.9 11/30/2017   PLT 247 11/30/2017    '@LASTCHEMISTRY'$ @  No results found for: LABCA2  No components found for: [id number]  No results for input(s): INR in the last 168 hours.  No results found for: LABCA2  No results found for: ZJQ734  No results found for: LPF790  No results found for: WIO973  No results found for: CA2729  No components found for: HGQUANT  No results found for: CEA1 / No results found for: CEA1   No results found for: AFPTUMOR  No results found for: CHROMOGRNA  No results found for: PSA1  No visits with results within 3 Day(s) from this visit.  Latest known visit with results is:  Hospital Outpatient Visit on 02/01/2018  Component Date Value Ref Range Status  . Hemoglobin 02/01/2018 13.6  12.0 - 15.0 g/dL Final   Performed at [facility], [place] [address], [place], [place] [address]  . Sodium 02/01/2018 140  135 - 145 mmol/L Final  . Potassium 02/01/2018 3.6  3.5 - 5.1 mmol/L Final  . Chloride 02/01/2018 107  101 - 111 mmol/L Final  . CO2 02/01/2018 24  22 - 32 mmol/L Final  . Glucose, Bld 02/01/2018 91  65 - 99 mg/dL Final  . BUN 02/01/2018 10  6 - 20 mg/dL Final  .  Creatinine, Ser 02/01/2018 0.79  0.44 - 1.00 mg/dL Final  . Calcium 02/01/2018 9.4  8.9 - 10.3 mg/dL Final  . GFR calc non Af Amer 02/01/2018 >60  >60 mL/min Final  . GFR calc Af Amer 02/01/2018 >60  >60 mL/min Final   Comment: (NOTE) The eGFR has been calculated using the CKD EPI equation. This calculation has not  been validated in all clinical situations. eGFR's persistently <60 mL/min signify possible Chronic Kidney Disease.   Georgiann Hahn gap 02/01/2018 9  5 - 15 Final   Performed at [facility], [place] [address], [place], [place] [address]    (this displays the last labs from the last 3 days)  No results found for: TOTALPROTELP, ALBUMINELP, A1GS, A2GS, BETS, BETA2SER, GAMS, MSPIKE, SPEI (this displays SPEP labs)  No results found for: KPAFRELGTCHN, LAMBDASER, KAPLAMBRATIO (kappa/lambda light chains)  No results found for: HGBA, HGBA2QUANT, HGBFQUANT, HGBSQUAN (Hemoglobinopathy evaluation)   No results found for: LDH  No results found for: IRON, TIBC, IRONPCTSAT (Iron and TIBC)  No results found for: FERRITIN  Urinalysis No results found for: COLORURINE, APPEARANCEUR, LABSPEC, PHURINE, GLUCOSEU, HGBUR, BILIRUBINUR, KETONESUR, PROTEINUR, UROBILINOGEN, NITRITE, LEUKOCYTESUR   STUDIES: Nm Sentinel Node Inj-no Rpt (breast)  Result Date: 02/09/2018 Sulfur colloid was injected by the nuclear medicine technologist for melanoma sentinel node.    ELIGIBLE FOR AVAILABLE RESEARCH PROTOCOL: no  ASSESSMENT: 62 y.o. [place], [place] Woman status post left breast upper outer quadrant biopsy 11/17/2017 showing invasive ductal carcinoma, grade 1, estrogen and progesterone receptor positive, HER-2 not amplified, with an MIB-1 of 5%.  (a) 3 additional left breast biopsies in different quadrants showed ductal carcinoma in situ  (1) left mastectomy and sentinel lymph node sampling 02/09/2018 pT1c pN0, stage IA invasive ductal carcinoma, grade 1, with negative margins.  2 axillary sentinel  lymph nodes were removed  (2) The Oncotype DX score was 0, predicting a risk of outside the breast recurrence over the next 9 years of 3% if the patient's only systemic therapy is antiestrogens for 5 years.  It also predicts no significant benefit from chemotherapy.  (3) adjuvant radiation not indicated  (4) anastrozole started 02/12/2018  PLAN: She did remarkably well with the surgery and has recovered fully.  So far she is tolerating anastrozole well but she has only been on it 2-1/2 weeks.  '@PATIENTFIRSTNAME'$ @   has completed her local treatment and is now ready to start anti-estrogens.  Today we discussed the difference between tamoxifen and anastrozole in detail. She understands that anastrozole and the aromatase inhibitors in general work by blocking estrogen production. Accordingly vaginal dryness, decrease in bone density, and of course hot flashes can result. The aromatase inhibitors can also negatively affect the cholesterol profile, although that is a minor effect. One out of 5 women on aromatase inhibitors we will feel "old and achy". This arthralgia/myalgia syndrome, which resembles fibromyalgia clinically, does resolve with stopping the medications. Accordingly this is not a reason to not try an aromatase inhibitor but it is a frequent reason to stop it (in other words 20% of women will not be able to tolerate these medications).  Tamoxifen on the other hand does not block estrogen production. It does not "take away a woman's estrogen". It blocks the estrogen receptor in breast cells. Like anastrozole, it can also cause hot flashes. As opposed to anastrozole, tamoxifen has many estrogen-like effects. It is technically an estrogen receptor modulator. This means that in some tissues tamoxifen works like estrogen-- for example it helps strengthen the bones. It tends to improve the cholesterol profile. It can cause thickening of the endometrial lining, and even endometrial polyps or rarely  cancer of the uterus.(The risk of uterine cancer due to tamoxifen is one additional cancer per thousand women year). It can cause vaginal wetness or stickiness. It can cause blood clots through this estrogen-like effect--the risk of blood  clots with tamoxifen is exactly the same as with birth control pills or hormone replacement.  Neither of these agents causes mood changes or weight gain, despite the popular belief that they can have these side effects. We have data from studies comparing either of these drugs with placebo, and in those cases the control group had the same amount of weight gain and depression as the group that took the drug.  All other information was given to her in writing so she could review it.  The plan at this point is to continue anastrozole for a total of 5 years unless she has significant difficulties with it.  So far the only side effect is some night sweats.  If this becomes more of a problem we will prescribe gabapentin.  I asked her to start vitamin D supplementation.  She enjoys walking and not of course is the perfect exercise for her.  She will need a baseline DEXA scan and she would like to obtain that through Dr. Berneta Sages office.  Her next visit there will be in January and that is fine to wait until then.  She will see me again in 3 months.  If she tolerates anastrozole as well as expected we will start broadening the follow-up.     Julicia Krieger, Virgie Dad, MD  03/06/18 2:45 PM Medical Oncology and Hematology [facility] [address] [place], [place] [address] Tel. [phone]    Fax. [phone]  This document serves as a record of services personally performed by Lurline Del, MD. It was created on his behalf by Sheron Nightingale, a trained medical scribe. The creation of this record is based on the scribe's personal observations and the provider's statements to them.   I have reviewed the above documentation for accuracy and  completeness, and I agree with the above.

## 2018-03-09 ENCOUNTER — Ambulatory Visit: Payer: BC Managed Care – PPO | Attending: Surgery | Admitting: Physical Therapy

## 2018-03-09 ENCOUNTER — Encounter: Payer: Self-pay | Admitting: Physical Therapy

## 2018-03-09 ENCOUNTER — Other Ambulatory Visit: Payer: Self-pay

## 2018-03-09 DIAGNOSIS — M25612 Stiffness of left shoulder, not elsewhere classified: Secondary | ICD-10-CM | POA: Diagnosis present

## 2018-03-09 DIAGNOSIS — Z483 Aftercare following surgery for neoplasm: Secondary | ICD-10-CM | POA: Diagnosis present

## 2018-03-09 DIAGNOSIS — R293 Abnormal posture: Secondary | ICD-10-CM | POA: Diagnosis present

## 2018-03-09 DIAGNOSIS — C50412 Malignant neoplasm of upper-outer quadrant of left female breast: Secondary | ICD-10-CM | POA: Insufficient documentation

## 2018-03-09 DIAGNOSIS — Z170 Estrogen receptor positive status [ER+]: Secondary | ICD-10-CM | POA: Diagnosis present

## 2018-03-09 NOTE — Therapy (Signed)
[place], [place], [address] Phone: [phone]   Fax:  [phone]  Physical Therapy Treatment  Patient Details  Name: Rita Roberts MRN: [id number] Date of Birth: 01-28-1956 Referring Provider: Dr. Alphonsa Overall   Encounter Date: 03/09/2018  PT End of Session - 03/09/18 1044    Visit Number  2    Number of Visits  2    PT Start Time  1020    PT Stop Time  1050    PT Time Calculation (min)  30 min    Activity Tolerance  Patient tolerated treatment well    Behavior During Therapy  [facility] for tasks assessed/performed       Past Medical History:  Diagnosis Date  . Anemia   . Arthritis    "probably in left shoulder" (02/09/2018)  . Breast cancer, left breast ([place]) 11/2017  . Family history of adverse reaction to anesthesia    "my mom may have; not sure" (02/09/2018)  . GERD (gastroesophageal reflux disease)   . Heartburn   . Pneumonia 1960    Past Surgical History:  Procedure Laterality Date  . BREAST BIOPSY Left 11/2017 X 4  . CESAREAN SECTION  1983  . COLONOSCOPY    . INCISION AND DRAINAGE ABSCESS ANAL  1990s  . MASTECTOMY COMPLETE / SIMPLE W/ SENTINEL NODE BIOPSY Left    LEFT MASTECTOMY WITH LEFT AXILLARY SENTINEL LYMPH NODE BIOPSY  . MASTECTOMY W/ SENTINEL NODE BIOPSY Left 02/09/2018   Procedure: LEFT MASTECTOMY WITH LEFT AXILLARY SENTINEL LYMPH NODE BIOPSY;  Surgeon: Alphonsa Overall, MD;  Location: [facility];  Service: General;  Laterality: Left;    There were no vitals filed for this visit.  Subjective Assessment - 03/09/18 1026    Subjective  Patient underwent left mastectomy and sentinel node biopsy (0/2 nodes) on 02/09/18. Oncotype score was 0 so no need for chemo. On Anastroxole x 5 years. Otherwise no further treatment needed.    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  It is ER/PR positive and HER2 negative with a Ki67  of 5%. Patient underwent left mastectomy and sentinel node biopsy (0/2 nodes) on 02/09/18. She has no other medical problems.    Patient Stated Goals  Make sure my left arm is ok    Currently in Pain?  Yes    Pain Score  3     Pain Location  Chest    Pain Orientation  Left    Pain Descriptors / Indicators  Sore    Pain Onset  1 to 4 weeks ago    Pain Frequency  Intermittent    Aggravating Factors   Using arm    Pain Relieving Factors  rest    Multiple Pain Sites  No         [facility] PT Assessment - 03/09/18 0001      Assessment   Medical Diagnosis  s/p left mastectomy and SLNB    Referring Provider  Dr. Alphonsa Overall    Onset Date/Surgical Date  02/09/18    Hand Dominance  Right    Prior Therapy  Baseline assessment      Precautions   Precautions  Other (comment)    Precaution Comments  left arm lymphedema      Restrictions   Weight Bearing Restrictions  No      Balance Screen   Has the patient fallen in the past 6 months  No    Has the patient had a decrease in activity level because of a fear of falling?   No    Is the patient reluctant to leave their home because of a fear of falling?   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 Some glue still present but no signs of infection. Mild swelling present inferior to incision site.      Posture/Postural Control   Posture/Postural Control  Postural limitations    Postural Limitations  Rounded Shoulders;Forward head      ROM /  Strength   AROM / PROM / Strength  AROM      AROM   AROM Assessment Site  Shoulder    Right/Left Shoulder  Left    Left Shoulder Extension  45 Degrees    Left Shoulder Flexion  132 Degrees    Left Shoulder ABduction  138 Degrees    Left Shoulder Internal Rotation  61 Degrees    Left Shoulder External Rotation  75 Degrees        LYMPHEDEMA/ONCOLOGY QUESTIONNAIRE - 03/09/18 1031      Type   Cancer Type  Left breast      Surgeries   Mastectomy Date  02/09/18    Sentinel Lymph Node Biopsy Date  02/09/18    Number Lymph Nodes Removed  2      Treatment   Active Chemotherapy Treatment  No    Past Chemotherapy Treatment  No    Active Radiation Treatment  No    Past Radiation Treatment  No    Current Hormone Treatment  Yes    Date  02/12/18    Drug Name  Anastrozole    Past Hormone Therapy  No      What other symptoms do you have   Are you Having Heaviness or Tightness  No    Are you having Pain  Yes    Are you having pitting edema  No    Is it Hard or Difficult finding clothes that fit  No    Do you have infections  No    Is there Decreased scar mobility  Yes    Stemmer Sign  No      Lymphedema Assessments   Lymphedema Assessments  Upper extremities      Right Upper Extremity Lymphedema   10 cm Proximal to Olecranon Process  31.5 cm    Olecranon Process  27.6 cm    10 cm Proximal to Ulnar Styloid Process  24.8 cm    Just Proximal to Ulnar Styloid Process  18.5 cm    Across Hand at [employer]  20.5 cm    At [place] of 2nd Digit  7.1 cm      Left Upper Extremity Lymphedema   10 cm Proximal to Olecranon Process  30.5 cm    Olecranon Process  26.7 cm  10 cm Proximal to Ulnar Styloid Process  24.1 cm    Just Proximal to Ulnar Styloid Process  17.7 cm    Across Hand at [employer]  20 cm    At [place] of 2nd Digit  6.8 cm        Quick Dash - 03/09/18 0001    Open a tight or new jar  Mild difficulty    Do heavy household chores (wash walls, wash floors)  No  difficulty    Carry a shopping bag or briefcase  No difficulty    Wash your back  Mild difficulty    Use a knife to cut food  No difficulty    Recreational activities in which you take some force or impact through your arm, shoulder, or hand (golf, hammering, tennis)  No difficulty    During the past week, to what extent has your arm, shoulder or hand problem interfered with your normal social activities with family, friends, neighbors, or groups?  Not at all    During the past week, to what extent has your arm, shoulder or hand problem limited your work or other regular daily activities  Not at all    Arm, shoulder, or hand pain.  Mild    Tingling (pins and needles) in your arm, shoulder, or hand  None    Difficulty Sleeping  No difficulty    DASH Score  6.82 %                     PT Education - 03/09/18 1044    Education provided  Yes    Education Details  Lymphedema risk and how to reduce risk; also reviewed HEP and encouraged her to focus on flexion and abduction stretches    Person(s) Educated  Patient    Methods  Explanation;Demonstration    Comprehension  Verbalized understanding          PT Long Term Goals - 03/09/18 1057      PT LONG TERM GOAL #1   Title  Patient will demonstrate she has returned to baseline with shoulder ROM and function post-operatively.    Time  8    Period  Weeks    Status  Partially Met      Breast Clinic Goals - 11/30/17 1204      Patient will be able to verbalize understanding of pertinent lymphedema risk reduction practices relevant to her diagnosis specifically related to skin care.   Time  1    Period  Days    Status  Achieved      Patient will be able to return demonstrate and/or verbalize understanding of the post-op home exercise program related to regaining shoulder range of motion.   Time  1    Period  Days    Status  Achieved      Patient will be able to verbalize understanding of the importance of attending the  postoperative After Breast Cancer Class for further lymphedema risk reduction education and therapeutic exercise.   Time  1    Period  Days    Status  Achieved           Plan - 03/09/18 1055    Clinical Impression Statement  Patient is doing very well s/p left mastectomy and SLNB. She appears to be limited about 20 degrees with both flexion and abduction compared to baseline but was educated today on ways to increase that. She requested to try doing more stretches at  home to improve her ROM and will call me with concerns. She also agreed to come to the ABC class in a few weeks for lymphedema risk reduction education.    Rehab Potential  Excellent    PT Treatment/Interventions  ADLs/Self Care Home Management;Patient/family education;Therapeutic exercise    PT Next Visit Plan  D/C pt per her request    PT Home Exercise Plan  Post op shoulder ROM HEP    Consulted and Agree with Plan of Care  Patient       Patient will benefit from skilled therapeutic intervention in order to improve the following deficits and impairments:  Decreased knowledge of precautions, Impaired UE functional use, Postural dysfunction, Decreased range of motion, Pain  Visit Diagnosis: Malignant neoplasm of upper-outer quadrant of left breast in female, estrogen receptor positive (HCC)  Abnormal posture  Stiffness of left shoulder, not elsewhere classified  Aftercare following surgery for neoplasm     Problem List Patient Active Problem List   Diagnosis Date Noted  . Breast cancer, stage 1, estrogen receptor positive, left ([facility]) 02/09/2018  . Malignant neoplasm of upper-outer quadrant of left breast in female, estrogen receptor positive ([place]) 11/23/2017    PHYSICAL THERAPY DISCHARGE SUMMARY  Visits from Start of Care: 2  Current functional level related to goals / functional outcomes: ROM with left shoulder flexion and abduction limited compared to baseline but improving per pt request.   Remaining  deficits: ROM limitations; see above   Education / Equipment: Lymphedema risk reduction education Plan: Patient agrees to discharge.  Patient goals were partially met. Patient is being discharged due to being pleased with the current functional level.  ?????    Annia Friendly, [place] 03/09/18 11:00 AM  [place], [place], [address] Phone: [phone]   Fax:  [phone]  Name: Rita Roberts MRN: [id number] Date of Birth: 1955-11-09

## 2018-06-07 ENCOUNTER — Inpatient Hospital Stay: Payer: BC Managed Care – PPO | Admitting: Oncology

## 2018-06-07 ENCOUNTER — Inpatient Hospital Stay: Payer: BC Managed Care – PPO

## 2018-06-07 ENCOUNTER — Telehealth: Payer: Self-pay | Admitting: Oncology

## 2018-06-07 NOTE — Telephone Encounter (Signed)
Cancelled 8/7 lab/fu per message left by patient. Per patient she will call back to reschedule.

## 2018-08-30 ENCOUNTER — Telehealth: Payer: Self-pay | Admitting: Adult Health

## 2018-08-30 NOTE — Telephone Encounter (Signed)
Patient called to cancel, will call back later to reschedule

## 2018-09-01 ENCOUNTER — Inpatient Hospital Stay: Payer: BC Managed Care – PPO | Admitting: Adult Health

## 2019-11-30 LAB — HM MAMMOGRAPHY

## 2020-01-03 ENCOUNTER — Ambulatory Visit: Payer: Self-pay | Attending: Family

## 2020-01-03 DIAGNOSIS — Z23 Encounter for immunization: Secondary | ICD-10-CM | POA: Insufficient documentation

## 2020-01-03 NOTE — Progress Notes (Signed)
   Covid-19 Vaccination Clinic  Name:  Rita Roberts    MRN: [id number] DOB: 1956/04/19  01/03/2020  [name] was observed post Covid-19 immunization for 15 minutes without incident. She was provided with Vaccine Information Sheet and instruction to access the V-Safe system.   [name] was instructed to call 911 with any severe reactions post vaccine: Marland Kitchen Difficulty breathing  . Swelling of face and throat  . A fast heartbeat  . A bad rash all over body  . Dizziness and weakness   Immunizations Administered    Name Date Dose VIS Date Route   Moderna COVID-19 Vaccine 01/03/2020  3:13 PM 0.5 mL 10/02/2019 Intramuscular   Manufacturer: Moderna   Lot: [id number]   [id number]

## 2020-02-05 ENCOUNTER — Ambulatory Visit: Payer: Self-pay | Attending: Family

## 2020-02-05 DIAGNOSIS — Z23 Encounter for immunization: Secondary | ICD-10-CM

## 2020-02-05 NOTE — Progress Notes (Signed)
   Covid-19 Vaccination Clinic  Name:  Rita Roberts    MRN: [id number] DOB: 1956-01-16  02/05/2020  [name] was observed post Covid-19 immunization for 15 minutes without incident. She was provided with Vaccine Information Sheet and instruction to access the V-Safe system.   [name] was instructed to call 911 with any severe reactions post vaccine: Marland Kitchen Difficulty breathing  . Swelling of face and throat  . A fast heartbeat  . A bad rash all over body  . Dizziness and weakness   Immunizations Administered    Name Date Dose VIS Date Route   Moderna COVID-19 Vaccine 02/05/2020  1:45 PM 0.5 mL 10/02/2019 Intramuscular   Manufacturer: Moderna   Lot: [id number]   [id number]

## 2020-04-29 ENCOUNTER — Other Ambulatory Visit: Payer: Self-pay

## 2020-04-29 ENCOUNTER — Encounter: Payer: Self-pay | Admitting: Internal Medicine

## 2020-04-29 ENCOUNTER — Ambulatory Visit: Payer: BC Managed Care – PPO | Admitting: Internal Medicine

## 2020-04-29 VITALS — BP 152/84 | HR 66 | Temp 98.1°F | Ht 61.6 in | Wt 192.8 lb

## 2020-04-29 DIAGNOSIS — I10 Essential (primary) hypertension: Secondary | ICD-10-CM | POA: Diagnosis not present

## 2020-04-29 DIAGNOSIS — Z113 Encounter for screening for infections with a predominantly sexual mode of transmission: Secondary | ICD-10-CM | POA: Diagnosis not present

## 2020-04-29 DIAGNOSIS — Z1159 Encounter for screening for other viral diseases: Secondary | ICD-10-CM | POA: Diagnosis not present

## 2020-04-29 NOTE — Patient Instructions (Signed)
DASH Eating Plan DASH stands for "Dietary Approaches to Stop Hypertension." The DASH eating plan is a healthy eating plan that has been shown to reduce high blood pressure (hypertension). It may also reduce your risk for type 2 diabetes, heart disease, and stroke. The DASH eating plan may also help with weight loss. What are tips for following this plan?  General guidelines  Avoid eating more than 2,300 mg (milligrams) of salt (sodium) a day. If you have hypertension, you may need to reduce your sodium intake to 1,500 mg a day.  Limit alcohol intake to no more than 1 drink a day for nonpregnant women and 2 drinks a day for men. One drink equals 12 oz of beer, 5 oz of wine, or 1 oz of hard liquor.  Work with your health care provider to maintain a healthy body weight or to lose weight. Ask what an ideal weight is for you.  Get at least 30 minutes of exercise that causes your heart to beat faster (aerobic exercise) most days of the week. Activities may include walking, swimming, or biking.  Work with your health care provider or diet and nutrition specialist (dietitian) to adjust your eating plan to your individual calorie needs. Reading food labels   Check food labels for the amount of sodium per serving. Choose foods with less than 5 percent of the Daily Value of sodium. Generally, foods with less than 300 mg of sodium per serving fit into this eating plan.  To find whole grains, look for the word "whole" as the first word in the ingredient list. Shopping  Buy products labeled as "low-sodium" or "no salt added."  Buy fresh foods. Avoid canned foods and premade or frozen meals. Cooking  Avoid adding salt when cooking. Use salt-free seasonings or herbs instead of table salt or sea salt. Check with your health care provider or pharmacist before using salt substitutes.  Do not fry foods. Cook foods using healthy methods such as baking, boiling, grilling, and broiling instead.  Cook with  heart-healthy oils, such as olive, canola, soybean, or sunflower oil. Meal planning  Eat a balanced diet that includes: ? 5 or more servings of fruits and vegetables each day. At each meal, try to fill half of your plate with fruits and vegetables. ? Up to 6-8 servings of whole grains each day. ? Less than 6 oz of lean meat, poultry, or fish each day. A 3-oz serving of meat is about the same size as a deck of cards. One egg equals 1 oz. ? 2 servings of low-fat dairy each day. ? A serving of nuts, seeds, or beans 5 times each week. ? Heart-healthy fats. Healthy fats called Omega-3 fatty acids are found in foods such as flaxseeds and coldwater fish, like sardines, salmon, and mackerel.  Limit how much you eat of the following: ? Canned or prepackaged foods. ? Food that is high in trans fat, such as fried foods. ? Food that is high in saturated fat, such as fatty meat. ? Sweets, desserts, sugary drinks, and other foods with added sugar. ? Full-fat dairy products.  Do not salt foods before eating.  Try to eat at least 2 vegetarian meals each week.  Eat more home-cooked food and less restaurant, buffet, and fast food.  When eating at a restaurant, ask that your food be prepared with less salt or no salt, if possible. What foods are recommended? The items listed may not be a complete list. Talk with your dietitian about   what dietary choices are best for you. Grains Whole-grain or whole-wheat bread. Whole-grain or whole-wheat pasta. Brown rice. Oatmeal. Quinoa. Bulgur. Whole-grain and low-sodium cereals. Pita bread. Low-fat, low-sodium crackers. Whole-wheat flour tortillas. Vegetables Fresh or frozen vegetables (raw, steamed, roasted, or grilled). Low-sodium or reduced-sodium tomato and vegetable juice. Low-sodium or reduced-sodium tomato sauce and tomato paste. Low-sodium or reduced-sodium canned vegetables. Fruits All fresh, dried, or frozen fruit. Canned fruit in natural juice (without  added sugar). Meat and other protein foods Skinless chicken or turkey. Ground chicken or turkey. Pork with fat trimmed off. Fish and seafood. Egg whites. Dried beans, peas, or lentils. Unsalted nuts, nut butters, and seeds. Unsalted canned beans. Lean cuts of beef with fat trimmed off. Low-sodium, lean deli meat. Dairy Low-fat (1%) or fat-free (skim) milk. Fat-free, low-fat, or reduced-fat cheeses. Nonfat, low-sodium ricotta or cottage cheese. Low-fat or nonfat yogurt. Low-fat, low-sodium cheese. Fats and oils Soft margarine without trans fats. Vegetable oil. Low-fat, reduced-fat, or light mayonnaise and salad dressings (reduced-sodium). Canola, safflower, olive, soybean, and sunflower oils. Avocado. Seasoning and other foods Herbs. Spices. Seasoning mixes without salt. Unsalted popcorn and pretzels. Fat-free sweets. What foods are not recommended? The items listed may not be a complete list. Talk with your dietitian about what dietary choices are best for you. Grains Baked goods made with fat, such as croissants, muffins, or some breads. Dry pasta or rice meal packs. Vegetables Creamed or fried vegetables. Vegetables in a cheese sauce. Regular canned vegetables (not low-sodium or reduced-sodium). Regular canned tomato sauce and paste (not low-sodium or reduced-sodium). Regular tomato and vegetable juice (not low-sodium or reduced-sodium). Pickles. Olives. Fruits Canned fruit in a light or heavy syrup. Fried fruit. Fruit in cream or butter sauce. Meat and other protein foods Fatty cuts of meat. Ribs. Fried meat. Bacon. Sausage. Bologna and other processed lunch meats. Salami. Fatback. Hotdogs. Bratwurst. Salted nuts and seeds. Canned beans with added salt. Canned or smoked fish. Whole eggs or egg yolks. Chicken or turkey with skin. Dairy Whole or 2% milk, cream, and half-and-half. Whole or full-fat cream cheese. Whole-fat or sweetened yogurt. Full-fat cheese. Nondairy creamers. Whipped toppings.  Processed cheese and cheese spreads. Fats and oils Butter. Stick margarine. Lard. Shortening. Ghee. Bacon fat. Tropical oils, such as coconut, palm kernel, or palm oil. Seasoning and other foods Salted popcorn and pretzels. Onion salt, garlic salt, seasoned salt, table salt, and sea salt. Worcestershire sauce. Tartar sauce. Barbecue sauce. Teriyaki sauce. Soy sauce, including reduced-sodium. Steak sauce. Canned and packaged gravies. Fish sauce. Oyster sauce. Cocktail sauce. Horseradish that you find on the shelf. Ketchup. Mustard. Meat flavorings and tenderizers. Bouillon cubes. Hot sauce and Tabasco sauce. Premade or packaged marinades. Premade or packaged taco seasonings. Relishes. Regular salad dressings. Where to find more information:  National Heart, Lung, and Blood Institute: www.nhlbi.nih.gov  American Heart Association: www.heart.org Summary  The DASH eating plan is a healthy eating plan that has been shown to reduce high blood pressure (hypertension). It may also reduce your risk for type 2 diabetes, heart disease, and stroke.  With the DASH eating plan, you should limit salt (sodium) intake to 2,300 mg a day. If you have hypertension, you may need to reduce your sodium intake to 1,500 mg a day.  When on the DASH eating plan, aim to eat more fresh fruits and vegetables, whole grains, lean proteins, low-fat dairy, and heart-healthy fats.  Work with your health care provider or diet and nutrition specialist (dietitian) to adjust your eating plan to your   individual calorie needs. This information is not intended to replace advice given to you by your health care provider. Make sure you discuss any questions you have with your health care provider. Document Revised: 09/30/2017 Document Reviewed: 10/11/2016 Elsevier Patient Education  2020 [employer]  

## 2020-04-29 NOTE — Progress Notes (Signed)
This visit occurred during the SARS-CoV-2 public health emergency.  Safety protocols were in place, including screening questions prior to the visit, additional usage of staff PPE, and extensive cleaning of exam room while observing appropriate contact time as indicated for disinfecting solutions.  Subjective:     Patient ID: Rita Roberts , female    DOB: 1956/08/29 , 64 y.o.   MRN: [id number]   Chief Complaint  Patient presents with  . Establish Care    HPI  She is here today to establish primary care.  She was referred by her GYN, Dr. Charlesetta Garibaldi. She wants to have her blood pressure evaluated.  States her BP readings were elevated while at the dentist. She was unable to have her cleaning performed.  She was advised not to come back until her BP has improved. She denies h/o HTN.  She denies headaches. She does admit to visual changes.   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 She is originally from [place].  She works in [place] at [employer].      Past Medical History:  Diagnosis Date  . Anemia   . Arthritis    "probably in left shoulder" (02/09/2018)  . Breast cancer, left breast ([place]) 11/2017  . Family history of adverse reaction to anesthesia    "my mom may have; not sure" (02/09/2018)  . GERD (gastroesophageal reflux disease)   . Heartburn   . Pneumonia 1960     Family History  Problem Relation Age of Onset  . Dementia Mother   . Lung cancer Father   . Healthy Sister      Current Outpatient Medications:  .  cholecalciferol (VITAMIN D) 1000 units tablet, Take 1 tablet (1,000 Units total) by mouth daily., Disp: , Rfl:  .  ibuprofen (ADVIL,MOTRIN) 200 MG tablet, Take 400 mg by mouth daily as needed for headache or moderate pain., Disp: , Rfl:  .  Nutritional Supplements (MENOPAUSE FORMULA) TABS, Take 1 tablet by mouth daily., Disp: , Rfl:    No Known Allergies    The patient states she uses post menopausal status for birth control. Last LMP was No LMP recorded. Patient is  postmenopausal.. Negative for Dysmenorrhea  Negative for: breast discharge, breast lump(s), breast pain and breast self exam. Associated symptoms include abnormal vaginal bleeding. Pertinent negatives include abnormal bleeding (hematology), anxiety, decreased libido, depression, difficulty falling sleep, dyspareunia, history of infertility, nocturia, sexual dysfunction, sleep disturbances, urinary incontinence, urinary urgency, vaginal discharge and vaginal itching. Diet regular.The patient states her exercise level is  intermittent.  . The patient's tobacco use is:  Social History   Tobacco Use  Smoking Status Former Smoker  . Packs/day: 0.50  . Years: 15.00  . Pack years: 7.50  . Types: Cigarettes  . Quit date: 45  . Years since quitting: 31.5  Smokeless Tobacco Never Used  . She has been exposed to passive smoke. The patient's alcohol use is:  Social History   Substance and Sexual Activity  Alcohol Use Not Currently    Review of Systems  Constitutional: Negative.   Respiratory: Negative.   Cardiovascular: Negative.   Gastrointestinal: Negative.   Neurological: Negative.   Psychiatric/Behavioral: Negative.      Today's Vitals   04/29/20 1525  BP: (!) 152/84  Pulse: 66  Temp: 98.1 F (36.7 C)  TempSrc: Oral  Weight: 192 lb 12.8 oz (87.5 kg)  Height: 5' 1.6" (1.565 m)   Body mass index is 35.72 kg/m.   Objective:  Physical Exam Vitals and nursing note reviewed.  Constitutional:      Appearance: Normal appearance. She is obese.  HENT:     Head: Normocephalic and atraumatic.  Cardiovascular:     Rate and Rhythm: Normal rate and regular rhythm.     Heart sounds: Normal heart sounds.  Pulmonary:     Effort: Pulmonary effort is normal.     Breath sounds: Normal breath sounds.  Skin:    General: Skin is warm.  Neurological:     General: No focal deficit present.     Mental Status: She is alert.  Psychiatric:        Mood and Affect: Mood normal.         Behavior: Behavior normal.         Assessment And Plan:    1. Essential hypertension, benign  New onset. Since she has had repeated episodes of elevated blood pressure, I will start her on medication. I will start her on valsartan '160mg'$  once daily. She will start with 1/2 tab for first few days, then increase to one tablet daily. I will check labs as listed below. EKG performed, NSR w/o acute changes, LVH is present. Pt advised that this is likely due to her persistent elevated blood pressure. She is encouraged to avoid adding salt to her foods,and to cut back on packaged foods which tend to be high in sodium. She will rto in four weeks for re-evaluation.   - ABO AND RH  - CMP14+EGFR - CBC no Diff - TSH - Lipid panel - EKG 12-Lead  2. Encounter for HCV screening test for low risk patient  - Hepatitis C antibody  3. Screen for STD (sexually transmitted disease)  - HIV antibody (with reflex)   Maximino Greenland, MD    THE PATIENT IS ENCOURAGED TO PRACTICE SOCIAL DISTANCING DUE TO THE COVID-19 PANDEMIC.

## 2020-04-30 LAB — CMP14+EGFR
ALT: 13 IU/L (ref 0–32)
AST: 13 IU/L (ref 0–40)
Albumin/Globulin Ratio: 1.6 (ref 1.2–2.2)
Albumin: 4.3 g/dL (ref 3.8–4.8)
Alkaline Phosphatase: 73 IU/L (ref 48–121)
BUN/Creatinine Ratio: 14 (ref 12–28)
BUN: 14 mg/dL (ref 8–27)
Bilirubin Total: 0.8 mg/dL (ref 0.0–1.2)
CO2: 26 mmol/L (ref 20–29)
Calcium: 9.6 mg/dL (ref 8.7–10.3)
Chloride: 106 mmol/L (ref 96–106)
Creatinine, Ser: 0.99 mg/dL (ref 0.57–1.00)
GFR calc Af Amer: 71 mL/min/{1.73_m2} (ref >59)
GFR calc non Af Amer: 61 mL/min/{1.73_m2} (ref >59)
Globulin, Total: 2.7 g/dL (ref 1.5–4.5)
Glucose: 107 mg/dL — ABNORMAL HIGH (ref 65–99)
Potassium: 4.1 mmol/L (ref 3.5–5.2)
Sodium: 145 mmol/L — ABNORMAL HIGH (ref 134–144)
Total Protein: 7 g/dL (ref 6.0–8.5)

## 2020-04-30 LAB — CBC
Hematocrit: 40 % (ref 34.0–46.6)
Hemoglobin: 13.5 g/dL (ref 11.1–15.9)
MCH: 29.2 pg (ref 26.6–33.0)
MCHC: 33.8 g/dL (ref 31.5–35.7)
MCV: 87 fL (ref 79–97)
Platelets: 238 10*3/uL (ref 150–450)
RBC: 4.62 x10E6/uL (ref 3.77–5.28)
RDW: 12.6 % (ref 11.7–15.4)
WBC: 4.5 10*3/uL (ref 3.4–10.8)

## 2020-04-30 LAB — LIPID PANEL
Chol/HDL Ratio: 5 ratio — ABNORMAL HIGH (ref 0.0–4.4)
Cholesterol, Total: 220 mg/dL — ABNORMAL HIGH (ref 100–199)
HDL: 44 mg/dL (ref >39)
LDL Chol Calc (NIH): 160 mg/dL — ABNORMAL HIGH (ref 0–99)
Triglycerides: 89 mg/dL (ref 0–149)
VLDL Cholesterol Cal: 16 mg/dL (ref 5–40)

## 2020-04-30 LAB — HIV ANTIBODY (ROUTINE TESTING W REFLEX): HIV Screen 4th Generation wRfx: NONREACTIVE

## 2020-04-30 LAB — ABO AND RH: Rh Factor: NEGATIVE

## 2020-04-30 LAB — ANTIBODY SCREEN: Antibody Screen: NEGATIVE

## 2020-04-30 LAB — TSH: TSH: 0.634 u[IU]/mL (ref 0.450–4.500)

## 2020-04-30 LAB — HEPATITIS C ANTIBODY: Hep C Virus Ab: <0.1 s/co ratio (ref 0.0–0.9)

## 2020-05-01 ENCOUNTER — Other Ambulatory Visit: Payer: Self-pay

## 2020-05-01 MED ORDER — VALSARTAN 160 MG PO TABS
160.0000 mg | ORAL_TABLET | Freq: Every day | ORAL | 1 refills | Status: DC
Start: 2020-05-01 — End: 2020-05-29

## 2020-05-07 ENCOUNTER — Encounter: Payer: Self-pay | Admitting: Internal Medicine

## 2020-05-08 ENCOUNTER — Encounter: Payer: Self-pay | Admitting: Internal Medicine

## 2020-05-16 ENCOUNTER — Encounter: Payer: Self-pay | Admitting: Internal Medicine

## 2020-05-29 ENCOUNTER — Ambulatory Visit: Payer: BC Managed Care – PPO | Admitting: Internal Medicine

## 2020-05-29 ENCOUNTER — Other Ambulatory Visit: Payer: Self-pay

## 2020-05-29 ENCOUNTER — Encounter: Payer: Self-pay | Admitting: Internal Medicine

## 2020-05-29 VITALS — BP 128/90 | HR 72 | Temp 98.7°F | Ht 61.6 in | Wt 190.4 lb

## 2020-05-29 DIAGNOSIS — E6609 Other obesity due to excess calories: Secondary | ICD-10-CM | POA: Diagnosis not present

## 2020-05-29 DIAGNOSIS — I10 Essential (primary) hypertension: Secondary | ICD-10-CM | POA: Diagnosis not present

## 2020-05-29 DIAGNOSIS — Z6835 Body mass index (BMI) 35.0-35.9, adult: Secondary | ICD-10-CM | POA: Diagnosis not present

## 2020-05-29 DIAGNOSIS — R351 Nocturia: Secondary | ICD-10-CM

## 2020-05-29 MED ORDER — VALSARTAN 160 MG PO TABS: 160.0000 mg | ORAL_TABLET | Freq: Every day | ORAL | 1 refills | Status: DC

## 2020-05-29 NOTE — Patient Instructions (Signed)
DASH Eating Plan DASH stands for "Dietary Approaches to Stop Hypertension." The DASH eating plan is a healthy eating plan that has been shown to reduce high blood pressure (hypertension). It may also reduce your risk for type 2 diabetes, heart disease, and stroke. The DASH eating plan may also help with weight loss. What are tips for following this plan?  General guidelines  Avoid eating more than 2,300 mg (milligrams) of salt (sodium) a day. If you have hypertension, you may need to reduce your sodium intake to 1,500 mg a day.  Limit alcohol intake to no more than 1 drink a day for nonpregnant women and 2 drinks a day for men. One drink equals 12 oz of beer, 5 oz of wine, or 1 oz of hard liquor.  Work with your health care provider to maintain a healthy body weight or to lose weight. Ask what an ideal weight is for you.  Get at least 30 minutes of exercise that causes your heart to beat faster (aerobic exercise) most days of the week. Activities may include walking, swimming, or biking.  Work with your health care provider or diet and nutrition specialist (dietitian) to adjust your eating plan to your individual calorie needs. Reading food labels   Check food labels for the amount of sodium per serving. Choose foods with less than 5 percent of the Daily Value of sodium. Generally, foods with less than 300 mg of sodium per serving fit into this eating plan.  To find whole grains, look for the word "whole" as the first word in the ingredient list. Shopping  Buy products labeled as "low-sodium" or "no salt added."  Buy fresh foods. Avoid canned foods and premade or frozen meals. Cooking  Avoid adding salt when cooking. Use salt-free seasonings or herbs instead of table salt or sea salt. Check with your health care provider or pharmacist before using salt substitutes.  Do not fry foods. Cook foods using healthy methods such as baking, boiling, grilling, and broiling instead.  Cook with  heart-healthy oils, such as olive, canola, soybean, or sunflower oil. Meal planning  Eat a balanced diet that includes: ? 5 or more servings of fruits and vegetables each day. At each meal, try to fill half of your plate with fruits and vegetables. ? Up to 6-8 servings of whole grains each day. ? Less than 6 oz of lean meat, poultry, or fish each day. A 3-oz serving of meat is about the same size as a deck of cards. One egg equals 1 oz. ? 2 servings of low-fat dairy each day. ? A serving of nuts, seeds, or beans 5 times each week. ? Heart-healthy fats. Healthy fats called Omega-3 fatty acids are found in foods such as flaxseeds and coldwater fish, like sardines, salmon, and mackerel.  Limit how much you eat of the following: ? Canned or prepackaged foods. ? Food that is high in trans fat, such as fried foods. ? Food that is high in saturated fat, such as fatty meat. ? Sweets, desserts, sugary drinks, and other foods with added sugar. ? Full-fat dairy products.  Do not salt foods before eating.  Try to eat at least 2 vegetarian meals each week.  Eat more home-cooked food and less restaurant, buffet, and fast food.  When eating at a restaurant, ask that your food be prepared with less salt or no salt, if possible. What foods are recommended? The items listed may not be a complete list. Talk with your dietitian about   what dietary choices are best for you. Grains Whole-grain or whole-wheat bread. Whole-grain or whole-wheat pasta. Brown rice. Oatmeal. Quinoa. Bulgur. Whole-grain and low-sodium cereals. Pita bread. Low-fat, low-sodium crackers. Whole-wheat flour tortillas. Vegetables Fresh or frozen vegetables (raw, steamed, roasted, or grilled). Low-sodium or reduced-sodium tomato and vegetable juice. Low-sodium or reduced-sodium tomato sauce and tomato paste. Low-sodium or reduced-sodium canned vegetables. Fruits All fresh, dried, or frozen fruit. Canned fruit in natural juice (without  added sugar). Meat and other protein foods Skinless chicken or turkey. Ground chicken or turkey. Pork with fat trimmed off. Fish and seafood. Egg whites. Dried beans, peas, or lentils. Unsalted nuts, nut butters, and seeds. Unsalted canned beans. Lean cuts of beef with fat trimmed off. Low-sodium, lean deli meat. Dairy Low-fat (1%) or fat-free (skim) milk. Fat-free, low-fat, or reduced-fat cheeses. Nonfat, low-sodium ricotta or cottage cheese. Low-fat or nonfat yogurt. Low-fat, low-sodium cheese. Fats and oils Soft margarine without trans fats. Vegetable oil. Low-fat, reduced-fat, or light mayonnaise and salad dressings (reduced-sodium). Canola, safflower, olive, soybean, and sunflower oils. Avocado. Seasoning and other foods Herbs. Spices. Seasoning mixes without salt. Unsalted popcorn and pretzels. Fat-free sweets. What foods are not recommended? The items listed may not be a complete list. Talk with your dietitian about what dietary choices are best for you. Grains Baked goods made with fat, such as croissants, muffins, or some breads. Dry pasta or rice meal packs. Vegetables Creamed or fried vegetables. Vegetables in a cheese sauce. Regular canned vegetables (not low-sodium or reduced-sodium). Regular canned tomato sauce and paste (not low-sodium or reduced-sodium). Regular tomato and vegetable juice (not low-sodium or reduced-sodium). Pickles. Olives. Fruits Canned fruit in a light or heavy syrup. Fried fruit. Fruit in cream or butter sauce. Meat and other protein foods Fatty cuts of meat. Ribs. Fried meat. Bacon. Sausage. Bologna and other processed lunch meats. Salami. Fatback. Hotdogs. Bratwurst. Salted nuts and seeds. Canned beans with added salt. Canned or smoked fish. Whole eggs or egg yolks. Chicken or turkey with skin. Dairy Whole or 2% milk, cream, and half-and-half. Whole or full-fat cream cheese. Whole-fat or sweetened yogurt. Full-fat cheese. Nondairy creamers. Whipped toppings.  Processed cheese and cheese spreads. Fats and oils Butter. Stick margarine. Lard. Shortening. Ghee. Bacon fat. Tropical oils, such as coconut, palm kernel, or palm oil. Seasoning and other foods Salted popcorn and pretzels. Onion salt, garlic salt, seasoned salt, table salt, and sea salt. Worcestershire sauce. Tartar sauce. Barbecue sauce. Teriyaki sauce. Soy sauce, including reduced-sodium. Steak sauce. Canned and packaged gravies. Fish sauce. Oyster sauce. Cocktail sauce. Horseradish that you find on the shelf. Ketchup. Mustard. Meat flavorings and tenderizers. Bouillon cubes. Hot sauce and Tabasco sauce. Premade or packaged marinades. Premade or packaged taco seasonings. Relishes. Regular salad dressings. Where to find more information:  National Heart, Lung, and Blood Institute: www.nhlbi.nih.gov  American Heart Association: www.heart.org Summary  The DASH eating plan is a healthy eating plan that has been shown to reduce high blood pressure (hypertension). It may also reduce your risk for type 2 diabetes, heart disease, and stroke.  With the DASH eating plan, you should limit salt (sodium) intake to 2,300 mg a day. If you have hypertension, you may need to reduce your sodium intake to 1,500 mg a day.  When on the DASH eating plan, aim to eat more fresh fruits and vegetables, whole grains, lean proteins, low-fat dairy, and heart-healthy fats.  Work with your health care provider or diet and nutrition specialist (dietitian) to adjust your eating plan to your   individual calorie needs. This information is not intended to replace advice given to you by your health care provider. Make sure you discuss any questions you have with your health care provider. Document Revised: 09/30/2017 Document Reviewed: 10/11/2016 Elsevier Patient Education  2020 Elsevier Inc. Hypertension, Adult Hypertension is another name for high blood pressure. High blood pressure forces your heart to work harder to pump  blood. This can cause problems over time. There are two numbers in a blood pressure reading. There is a top number (systolic) over a bottom number (diastolic). It is best to have a blood pressure that is below 120/80. Healthy choices can help lower your blood pressure, or you may need medicine to help lower it. What are the causes? The cause of this condition is not known. Some conditions may be related to high blood pressure. What increases the risk?  Smoking.  Having type 2 diabetes mellitus, high cholesterol, or both.  Not getting enough exercise or physical activity.  Being overweight.  Having too much fat, sugar, calories, or salt (sodium) in your diet.  Drinking too much alcohol.  Having long-term (chronic) kidney disease.  Having a family history of high blood pressure.  Age. Risk increases with age.  Race. You may be at higher risk if you are African American.  Gender. Men are at higher risk than women before age 45. After age 65, women are at higher risk than men.  Having obstructive sleep apnea.  Stress. What are the signs or symptoms?  High blood pressure may not cause symptoms. Very high blood pressure (hypertensive crisis) may cause: ? Headache. ? Feelings of worry or nervousness (anxiety). ? Shortness of breath. ? Nosebleed. ? A feeling of being sick to your stomach (nausea). ? Throwing up (vomiting). ? Changes in how you see. ? Very bad chest pain. ? Seizures. How is this treated?  This condition is treated by making healthy lifestyle changes, such as: ? Eating healthy foods. ? Exercising more. ? Drinking less alcohol.  Your health care provider may prescribe medicine if lifestyle changes are not enough to get your blood pressure under control, and if: ? Your top number is above 130. ? Your bottom number is above 80.  Your personal target blood pressure may vary. Follow these instructions at home: Eating and drinking   If told, follow the DASH  eating plan. To follow this plan: ? Fill one half of your plate at each meal with fruits and vegetables. ? Fill one fourth of your plate at each meal with whole grains. Whole grains include whole-wheat pasta, brown rice, and whole-grain bread. ? Eat or drink low-fat dairy products, such as skim milk or low-fat yogurt. ? Fill one fourth of your plate at each meal with low-fat (lean) proteins. Low-fat proteins include fish, chicken without skin, eggs, beans, and tofu. ? Avoid fatty meat, cured and processed meat, or chicken with skin. ? Avoid pre-made or processed food.  Eat less than 1,500 mg of salt each day.  Do not drink alcohol if: ? Your doctor tells you not to drink. ? You are pregnant, may be pregnant, or are planning to become pregnant.  If you drink alcohol: ? Limit how much you use to:  0-1 drink a day for women.  0-2 drinks a day for men. ? Be aware of how much alcohol is in your drink. In the [place] one drink equals one 12 oz bottle of beer (355 mL), one 5 oz glass of wine (148 mL),   or one 1 oz glass of hard liquor (44 mL). Lifestyle   Work with your doctor to stay at a healthy weight or to lose weight. Ask your doctor what the best weight is for you.  Get at least 30 minutes of exercise most days of the week. This may include walking, swimming, or biking.  Get at least 30 minutes of exercise that strengthens your muscles (resistance exercise) at least 3 days a week. This may include lifting weights or doing Pilates.  Do not use any products that contain nicotine or tobacco, such as cigarettes, e-cigarettes, and chewing tobacco. If you need help quitting, ask your doctor.  Check your blood pressure at home as told by your doctor.  Keep all follow-up visits as told by your doctor. This is important. Medicines  Take over-the-counter and prescription medicines only as told by your doctor. Follow directions carefully.  Do not skip doses of blood pressure medicine. The  medicine does not work as well if you skip doses. Skipping doses also puts you at risk for problems.  Ask your doctor about side effects or reactions to medicines that you should watch for. Contact a doctor if you:  Think you are having a reaction to the medicine you are taking.  Have headaches that keep coming back (recurring).  Feel dizzy.  Have swelling in your ankles.  Have trouble with your vision. Get help right away if you:  Get a very bad headache.  Start to feel mixed up (confused).  Feel weak or numb.  Feel faint.  Have very bad pain in your: ? Chest. ? Belly (abdomen).  Throw up more than once.  Have trouble breathing. Summary  Hypertension is another name for high blood pressure.  High blood pressure forces your heart to work harder to pump blood.  For most people, a normal blood pressure is less than 120/80.  Making healthy choices can help lower blood pressure. If your blood pressure does not get lower with healthy choices, you may need to take medicine. This information is not intended to replace advice given to you by your health care provider. Make sure you discuss any questions you have with your health care provider. Document Revised: 06/28/2018 Document Reviewed: 06/28/2018 Elsevier Patient Education  2020 Elsevier Inc.  

## 2020-05-29 NOTE — Progress Notes (Signed)
I,Katawbba Wiggins,acting as a Education administrator for Maximino Greenland, MD.,have documented all relevant documentation on the behalf of Maximino Greenland, MD,as directed by  Maximino Greenland, MD while in the presence of Maximino Greenland, MD.  This visit occurred during the SARS-CoV-2 public health emergency.  Safety protocols were in place, including screening questions prior to the visit, additional usage of staff PPE, and extensive cleaning of exam room while observing appropriate contact time as indicated for disinfecting solutions.  Subjective:     Patient ID: Rita Roberts , female    DOB: [birth year] , 64 y.o.   MRN: [id number]   Chief Complaint  Patient presents with  . Hypertension    HPI  The patient is here today for a follow-up on her blood pressure.  The patient was started on Valsartan 160 mg daily at her last visit and she reports compliance with her medications.  Hypertension This is a chronic problem. The current episode started more than 1 year ago. The problem has been gradually improving since onset. The problem is uncontrolled. Pertinent negatives include no blurred vision, chest pain, palpitations or shortness of breath. Risk factors for coronary artery disease include post-menopausal state. Past treatments include angiotensin blockers. The current treatment provides moderate improvement.     Past Medical History:  Diagnosis Date  . Anemia   . Arthritis    "probably in left shoulder" (02/09/2018)  . Breast cancer, left breast ([place]) 11/2017  . Family history of adverse reaction to anesthesia    "my mom may have; not sure" (02/09/2018)  . GERD (gastroesophageal reflux disease)   . Heartburn   . Pneumonia 1960     Family History  Problem Relation Age of Onset  . Dementia Mother   . Lung cancer Father   . Healthy Sister      Current Outpatient Medications:  .  cholecalciferol (VITAMIN D) 1000 units tablet, Take 1 tablet (1,000 Units total) by mouth daily., Disp: , Rfl:  .   ibuprofen (ADVIL,MOTRIN) 200 MG tablet, Take 400 mg by mouth daily as needed for headache or moderate pain., Disp: , Rfl:  .  NON FORMULARY, Sea moss, Disp: , Rfl:  .  Nutritional Supplements (MENOPAUSE FORMULA) TABS, Take 1 tablet by mouth daily., Disp: , Rfl:  .  valsartan (DIOVAN) 160 MG tablet, Take 1 tablet (160 mg total) by mouth daily., Disp: 90 tablet, Rfl: 1   No Known Allergies   Review of Systems  Constitutional: Negative.   Eyes: Negative for blurred vision.  Respiratory: Negative.  Negative for shortness of breath.   Cardiovascular: Negative.  Negative for chest pain and palpitations.  Gastrointestinal: Negative.   Psychiatric/Behavioral: Negative.   All other systems reviewed and are negative.    Today's Vitals   05/29/20 1226  BP: (!) 128/90  Pulse: 72  Temp: 98.7 F (37.1 C)  TempSrc: Oral  Weight: 190 lb 6.4 oz (86.4 kg)  Height: 5' 1.6" (1.565 m)   Body mass index is 35.28 kg/m.  Wt Readings from Last 3 Encounters:  05/29/20 190 lb 6.4 oz (86.4 kg)  04/29/20 192 lb 12.8 oz (87.5 kg)  03/06/18 188 lb 1.6 oz (85.3 kg)   Objective:  Physical Exam Vitals and nursing note reviewed.  Constitutional:      Appearance: Normal appearance. She is obese.  HENT:     Head: Normocephalic and atraumatic.  Cardiovascular:     Rate and Rhythm: Normal rate and regular rhythm.  Heart sounds: Normal heart sounds.  Pulmonary:     Breath sounds: Normal breath sounds.  Skin:    General: Skin is warm.  Neurological:     General: No focal deficit present.     Mental Status: She is alert and oriented to person, place, and time.         Assessment And Plan:     1. Essential hypertension, benign  Comments: Chronic, improved control.  BP not yet at goal. She will continue with current meds for now. She was congratulated on her lifestyle changes thus far.  I will check renal function today. She will rto in 3 months for re-evaluation.   - BMP8+EGFR  2.  Nocturia  Comments: We discussed this is a common symptoms of OSA. She is agreeable to Neuro referral for sleep study testing. PT advised that if she does have OSA, this could be contributing to her high blood pressure.   3. Class 2 obesity due to excess calories with body mass index (BMI) of 35.0 to 35.9 in adult, unspecified whether serious comorbidity present   She is encouraged to strive for BMI less than 30 to decrease cardiac risk. Advised to aim for at least 150 minutes of exercise per week.   Patient was given opportunity to ask questions. Patient verbalized understanding of the plan and was able to repeat key elements of the plan. All questions were answered to their satisfaction.  Maximino Greenland, MD   I, Maximino Greenland, MD, have reviewed all documentation for this visit. The documentation on 06/01/20 for the exam, diagnosis, procedures, and orders are all accurate and complete.  THE PATIENT IS ENCOURAGED TO PRACTICE SOCIAL DISTANCING DUE TO THE COVID-19 PANDEMIC.

## 2020-05-30 LAB — BMP8+EGFR
BUN/Creatinine Ratio: 15 (ref 12–28)
BUN: 14 mg/dL (ref 8–27)
CO2: 25 mmol/L (ref 20–29)
Calcium: 9.9 mg/dL (ref 8.7–10.3)
Chloride: 105 mmol/L (ref 96–106)
Creatinine, Ser: 0.96 mg/dL (ref 0.57–1.00)
GFR calc Af Amer: 73 mL/min/{1.73_m2} (ref >59)
GFR calc non Af Amer: 64 mL/min/{1.73_m2} (ref >59)
Glucose: 89 mg/dL (ref 65–99)
Potassium: 3.8 mmol/L (ref 3.5–5.2)
Sodium: 143 mmol/L (ref 134–144)

## 2020-09-02 ENCOUNTER — Other Ambulatory Visit: Payer: Self-pay

## 2020-09-02 ENCOUNTER — Encounter: Payer: Self-pay | Admitting: Internal Medicine

## 2020-09-02 ENCOUNTER — Ambulatory Visit: Payer: BC Managed Care – PPO | Admitting: Internal Medicine

## 2020-09-02 VITALS — BP 124/86 | HR 76 | Temp 98.2°F | Ht 61.6 in | Wt 192.8 lb

## 2020-09-02 DIAGNOSIS — Z6835 Body mass index (BMI) 35.0-35.9, adult: Secondary | ICD-10-CM

## 2020-09-02 DIAGNOSIS — Z23 Encounter for immunization: Secondary | ICD-10-CM

## 2020-09-02 DIAGNOSIS — E6609 Other obesity due to excess calories: Secondary | ICD-10-CM

## 2020-09-02 DIAGNOSIS — I10 Essential (primary) hypertension: Secondary | ICD-10-CM

## 2020-09-02 LAB — BMP8+EGFR
BUN/Creatinine Ratio: 11 — ABNORMAL LOW (ref 12–28)
BUN: 11 mg/dL (ref 8–27)
CO2: 24 mmol/L (ref 20–29)
Calcium: 9.6 mg/dL (ref 8.7–10.3)
Chloride: 106 mmol/L (ref 96–106)
Creatinine, Ser: 0.99 mg/dL (ref 0.57–1.00)
GFR calc Af Amer: 71 mL/min/{1.73_m2} (ref >59)
GFR calc non Af Amer: 61 mL/min/{1.73_m2} (ref >59)
Glucose: 84 mg/dL (ref 65–99)
Potassium: 4.2 mmol/L (ref 3.5–5.2)
Sodium: 144 mmol/L (ref 134–144)

## 2020-09-02 MED ORDER — VALSARTAN 160 MG PO TABS: 160.0000 mg | ORAL_TABLET | Freq: Every day | ORAL | 1 refills | Status: DC

## 2020-09-02 NOTE — Progress Notes (Signed)
I,Katawbba Wiggins,acting as a Education administrator for Maximino Greenland, MD.,have documented all relevant documentation on the behalf of Maximino Greenland, MD,as directed by  Maximino Greenland, MD while in the presence of Maximino Greenland, MD.  This visit occurred during the SARS-CoV-2 public health emergency.  Safety protocols were in place, including screening questions prior to the visit, additional usage of staff PPE, and extensive cleaning of exam room while observing appropriate contact time as indicated for disinfecting solutions.  Subjective:     Patient ID: Rita Roberts , female    DOB: January 31, 1956 , 64 y.o.   MRN: [id number]   Chief Complaint  Patient presents with  . Hypertension    HPI  The patient is here today for a follow-up on her blood pressure.  The patient was started on Valsartan 160 mg daily at her last visit and she reports compliance with her medications.  Hypertension This is a chronic problem. The current episode started more than 1 year ago. The problem has been gradually improving since onset. The problem is uncontrolled. Pertinent negatives include no blurred vision, chest pain, palpitations or shortness of breath. Risk factors for coronary artery disease include post-menopausal state. Past treatments include angiotensin blockers. The current treatment provides moderate improvement.     Past Medical History:  Diagnosis Date  . Anemia   . Arthritis    "probably in left shoulder" (02/09/2018)  . Breast cancer, left breast ([place]) 11/2017  . Family history of adverse reaction to anesthesia    "my mom may have; not sure" (02/09/2018)  . GERD (gastroesophageal reflux disease)   . Heartburn   . Pneumonia 1960     Family History  Problem Relation Age of Onset  . Dementia Mother   . Lung cancer Father   . Healthy Sister      Current Outpatient Medications:  .  Calcium Carbonate-Vitamin D (CALTRATE 600+D PO), Take by mouth. 1 daily, Disp: , Rfl:  .  ibuprofen (ADVIL,MOTRIN)  200 MG tablet, Take 400 mg by mouth daily as needed for headache or moderate pain., Disp: , Rfl:  .  Nutritional Supplements (MENOPAUSE FORMULA) TABS, Take 1 tablet by mouth daily., Disp: , Rfl:  .  valsartan (DIOVAN) 160 MG tablet, Take 1 tablet (160 mg total) by mouth daily., Disp: 90 tablet, Rfl: 1 .  NON FORMULARY, Sea moss (Patient not taking: Reported on 09/02/2020), Disp: , Rfl:    No Known Allergies   Review of Systems  Constitutional: Negative.   Eyes: Negative for blurred vision.  Respiratory: Negative.  Negative for shortness of breath.   Cardiovascular: Negative.  Negative for chest pain and palpitations.  Gastrointestinal: Negative.   Neurological: Negative.   Psychiatric/Behavioral: Negative.      Today's Vitals   09/02/20 1133  BP: 124/86  Pulse: 76  Temp: 98.2 F (36.8 C)  TempSrc: Oral  Weight: 192 lb 12.8 oz (87.5 kg)  Height: 5' 1.6" (1.565 m)   Body mass index is 35.72 kg/m.  Wt Readings from Last 3 Encounters:  09/02/20 192 lb 12.8 oz (87.5 kg)  05/29/20 190 lb 6.4 oz (86.4 kg)  04/29/20 192 lb 12.8 oz (87.5 kg)   Objective:  Physical Exam Vitals and nursing note reviewed.  Constitutional:      Appearance: Normal appearance.  HENT:     Head: Normocephalic and atraumatic.  Cardiovascular:     Rate and Rhythm: Normal rate and regular rhythm.     Heart sounds: Normal heart sounds.  Pulmonary:     Effort: Pulmonary effort is normal.     Breath sounds: Normal breath sounds.  Skin:    General: Skin is warm.  Neurological:     General: No focal deficit present.     Mental Status: She is alert.  Psychiatric:        Mood and Affect: Mood normal.        Behavior: Behavior normal.         Assessment And Plan:     1. Essential hypertension, benign Comments: This is now well controlled. She will continue with current meds. Encouraged to avoid adding salt to her foods. She will f/u in six months for physical exam. I will check renal function today.   - BMP8+EGFR  2. Class 2 obesity due to excess calories with body mass index (BMI) of 35.0 to 35.9 in adult, unspecified whether serious comorbidity present Comments: She was congratulated on not having any weight gain. Encouraged to incorporate more strength training into her exercise routine. Advised to aim for BMI less tha  3. Need for vaccination Comments: She was given Tdap to update her immunzation history.  - Tdap vaccine greater than or equal to 7yo IM   Patient was given opportunity to ask questions. Patient verbalized understanding of the plan and was able to repeat key elements of the plan. All questions were answered to their satisfaction.  Maximino Greenland, MD   I, Maximino Greenland, MD, have reviewed all documentation for this visit. The documentation on 09/02/20 for the exam, diagnosis, procedures, and orders are all accurate and complete.  THE PATIENT IS ENCOURAGED TO PRACTICE SOCIAL DISTANCING DUE TO THE COVID-19 PANDEMIC.

## 2020-09-02 NOTE — Patient Instructions (Signed)

## 2020-10-02 ENCOUNTER — Ambulatory Visit: Payer: Self-pay | Attending: Family

## 2020-10-02 DIAGNOSIS — Z23 Encounter for immunization: Secondary | ICD-10-CM

## 2020-10-06 ENCOUNTER — Ambulatory Visit: Payer: Self-pay

## 2020-12-01 LAB — HM PAP SMEAR: HM Pap smear: POSITIVE

## 2020-12-01 LAB — HM MAMMOGRAPHY

## 2021-01-16 NOTE — Progress Notes (Signed)
   Covid-19 Vaccination Clinic  Name:  Rita Roberts    MRN: [id number] DOB: Mar 26, 1956  01/16/2021  [name] was observed post Covid-19 immunization for 15 minutes without incident. She was provided with Vaccine Information Sheet and instruction to access the V-Safe system.   [name] was instructed to call 911 with any severe reactions post vaccine: [place] Kitchen Difficulty breathing  . Swelling of face and throat  . A fast heartbeat  . A bad rash all over body  . Dizziness and weakness   Immunizations Administered    Name Date Dose VIS Date Route   Moderna Covid-19 Booster Vaccine 10/02/2020  2:00 PM 0.25 mL 08/20/2020 Intramuscular   Manufacturer: Moderna   Lot: [id number]   [place]: [id number]

## 2021-03-03 ENCOUNTER — Ambulatory Visit: Payer: BC Managed Care – PPO | Admitting: Internal Medicine

## 2021-03-04 ENCOUNTER — Ambulatory Visit (INDEPENDENT_AMBULATORY_CARE_PROVIDER_SITE_OTHER): Payer: BC Managed Care – PPO | Admitting: Internal Medicine

## 2021-03-04 ENCOUNTER — Other Ambulatory Visit: Payer: Self-pay

## 2021-03-04 ENCOUNTER — Encounter: Payer: Self-pay | Admitting: Internal Medicine

## 2021-03-04 VITALS — BP 130/86 | HR 66 | Temp 98.4°F | Ht 61.4 in | Wt 193.0 lb

## 2021-03-04 DIAGNOSIS — Z0001 Encounter for general adult medical examination with abnormal findings: Secondary | ICD-10-CM | POA: Diagnosis not present

## 2021-03-04 DIAGNOSIS — E66812 Morbid (severe) obesity due to excess calories: Secondary | ICD-10-CM

## 2021-03-04 DIAGNOSIS — E6609 Other obesity due to excess calories: Secondary | ICD-10-CM

## 2021-03-04 DIAGNOSIS — Z0000 Encounter for general adult medical examination without abnormal findings: Secondary | ICD-10-CM

## 2021-03-04 DIAGNOSIS — I10 Essential (primary) hypertension: Secondary | ICD-10-CM | POA: Diagnosis not present

## 2021-03-04 DIAGNOSIS — C50912 Malignant neoplasm of unspecified site of left female breast: Secondary | ICD-10-CM

## 2021-03-04 DIAGNOSIS — E049 Nontoxic goiter, unspecified: Secondary | ICD-10-CM | POA: Diagnosis not present

## 2021-03-04 DIAGNOSIS — Z170 Estrogen receptor positive status [ER+]: Secondary | ICD-10-CM

## 2021-03-04 DIAGNOSIS — Z6835 Body mass index (BMI) 35.0-35.9, adult: Secondary | ICD-10-CM

## 2021-03-04 LAB — POCT UA - MICROALBUMIN
Albumin/Creatinine Ratio, Urine, POC: <30
Creatinine, POC: 200 mg/dL
Microalbumin Ur, POC: 10 mg/L

## 2021-03-04 LAB — POCT URINALYSIS DIPSTICK
Bilirubin, UA: NEGATIVE
Glucose, UA: NEGATIVE
Ketones, UA: NEGATIVE
Leukocytes, UA: NEGATIVE
Nitrite, UA: NEGATIVE
Protein, UA: NEGATIVE
Spec Grav, UA: 1.015 (ref 1.010–1.025)
Urobilinogen, UA: 0.2 E.U./dL
pH, UA: 6 (ref 5.0–8.0)

## 2021-03-04 MED ORDER — AMLODIPINE BESYLATE 2.5 MG PO TABS
2.5000 mg | ORAL_TABLET | Freq: Every day | ORAL | 11 refills | Status: DC
Start: 2021-03-04 — End: 2021-09-17

## 2021-03-04 MED ORDER — VALSARTAN 160 MG PO TABS: 160.0000 mg | ORAL_TABLET | Freq: Every day | ORAL | 1 refills | Status: DC

## 2021-03-04 NOTE — Progress Notes (Signed)
I,Katawbba Wiggins,acting as a Education administrator for Maximino Greenland, MD.,have documented all relevant documentation on the behalf of Maximino Greenland, MD,as directed by  Maximino Greenland, MD while in the presence of Maximino Greenland, MD.  This visit occurred during the SARS-CoV-2 public health emergency.  Safety protocols were in place, including screening questions prior to the visit, additional usage of staff PPE, and extensive cleaning of exam room while observing appropriate contact time as indicated for disinfecting solutions.  Subjective:     Patient ID: Rita Roberts , female    DOB: 01/11/1956 , 65 y.o.   MRN: [id number]   Chief Complaint  Patient presents with  . Annual Exam  . Hypertension    HPI  The patient is here for a physical examination.  That patient has her GYN exams by Dr. Crawford Givens, last exam was Jan. 2022. She reports compliance with meds. She denies headaches, chest pain and shortness of breath.   Hypertension This is a chronic problem. The current episode started more than 1 year ago. The problem has been gradually improving since onset. The problem is uncontrolled. Pertinent negatives include no blurred vision, chest pain, palpitations or shortness of breath. Risk factors for coronary artery disease include post-menopausal state. Past treatments include angiotensin blockers. The current treatment provides moderate improvement.     Past Medical History:  Diagnosis Date  . Anemia   . Arthritis    "probably in left shoulder" (02/09/2018)  . Breast cancer, left breast ([facility]) 11/2017  . Family history of adverse reaction to anesthesia    "my mom may have; not sure" (02/09/2018)  . GERD (gastroesophageal reflux disease)   . Heartburn   . Pneumonia 1960     Family History  Problem Relation Age of Onset  . Dementia Mother   . Lung cancer Father   . Healthy Sister      Current Outpatient Medications:  .  amLODipine (NORVASC) 2.5 MG tablet, Take 1 tablet (2.5 mg total)  by mouth daily. Take with evening meal, Disp: 30 tablet, Rfl: 11 .  ibuprofen (ADVIL,MOTRIN) 200 MG tablet, Take 400 mg by mouth daily as needed for headache or moderate pain., Disp: , Rfl:  .  NON FORMULARY, Sea moss, Disp: , Rfl:  .  Nutritional Supplements (MENOPAUSE FORMULA) TABS, Take 1 tablet by mouth daily., Disp: , Rfl:  .  valsartan (DIOVAN) 160 MG tablet, Take 1 tablet (160 mg total) by mouth daily., Disp: 90 tablet, Rfl: 1   No Known Allergies    The patient states she uses none for birth control. Last LMP was No LMP recorded. Patient is postmenopausal.. Negative for Dysmenorrhea. Negative for: breast discharge, breast lump(s), breast pain and breast self exam. Associated symptoms include abnormal vaginal bleeding. Pertinent negatives include abnormal bleeding (hematology), anxiety, decreased libido, depression, difficulty falling sleep, dyspareunia, history of infertility, nocturia, sexual dysfunction, sleep disturbances, urinary incontinence, urinary urgency, vaginal discharge and vaginal itching. Diet regular.The patient states her exercise level is  intermittent.  . The patient's tobacco use is:  Social History   Tobacco Use  Smoking Status Former Smoker  . Packs/day: 0.50  . Years: 15.00  . Pack years: 7.50  . Types: Cigarettes  . Quit date: 34  . Years since quitting: 32.3  Smokeless Tobacco Never Used  . She has been exposed to passive smoke. The patient's alcohol use is:  Social History   Substance and Sexual Activity  Alcohol Use Not Currently   Review of  Systems  Constitutional: Negative.   HENT: Negative.   Eyes: Negative.  Negative for blurred vision.  Respiratory: Negative.  Negative for shortness of breath.   Cardiovascular: Negative.  Negative for chest pain and palpitations.  Gastrointestinal: Negative.   Endocrine: Negative.   Genitourinary: Negative.   Musculoskeletal: Negative.   Skin: Negative.   Allergic/Immunologic: Negative.   Neurological:  Negative.   Hematological: Negative.   Psychiatric/Behavioral: Negative.      Today's Vitals   03/04/21 0957 03/04/21 1056  BP: (!) 150/98 130/86  Pulse: 70 66  Temp: 98.4 F (36.9 C)   TempSrc: Oral   Weight: 193 lb (87.5 kg)   Height: 5' 1.4" (1.56 m)    Body mass index is 35.99 kg/m.  Wt Readings from Last 3 Encounters:  03/04/21 193 lb (87.5 kg)  09/02/20 192 lb 12.8 oz (87.5 kg)  05/29/20 190 lb 6.4 oz (86.4 kg)   Objective:  Physical Exam Vitals and nursing note reviewed.  Constitutional:      Appearance: Normal appearance. She is obese.  HENT:     Head: Normocephalic and atraumatic.     Right Ear: Tympanic membrane, ear canal and external ear normal.     Left Ear: Tympanic membrane, ear canal and external ear normal.     Nose:     Comments: Masked     Mouth/Throat:     Comments: Masked  Eyes:     Extraocular Movements: Extraocular movements intact.     Conjunctiva/sclera: Conjunctivae normal.     Pupils: Pupils are equal, round, and reactive to light.  Neck:     Thyroid: Thyromegaly present. No thyroid tenderness.     Comments: goiter Cardiovascular:     Rate and Rhythm: Normal rate and regular rhythm.     Pulses: Normal pulses.     Heart sounds: Normal heart sounds.  Pulmonary:     Effort: Pulmonary effort is normal.     Breath sounds: Normal breath sounds.  Chest:  Breasts:     Tanner Score is 5.     Right: Normal.     Left: Absent.      Comments: Left mastectomy Abdominal:     General: Bowel sounds are normal.     Palpations: Abdomen is soft.  Genitourinary:    Comments: deferred Musculoskeletal:        General: Normal range of motion.     Cervical back: Normal range of motion and neck supple.     Right lower leg: Edema present.     Left lower leg: Edema present.  Lymphadenopathy:     Cervical: No cervical adenopathy.  Skin:    General: Skin is warm and dry.  Neurological:     General: No focal deficit present.     Mental Status: She  is alert and oriented to person, place, and time.  Psychiatric:        Mood and Affect: Mood normal.        Behavior: Behavior normal.         Assessment And Plan:     1. Routine general medical examination at health care facility Comments: A full exam was performed.  Importance of monthly self breast exams was discussed with the patient. PATIENT IS ADVISED TO GET 30-45 MINUTES REGULAR EXERCISE NO LESS THAN FOUR TO FIVE DAYS PER WEEK - BOTH WEIGHTBEARING EXERCISES AND AEROBIC ARE RECOMMENDED.  PATIENT IS ADVISED TO FOLLOW A HEALTHY DIET WITH AT LEAST SIX FRUITS/VEGGIES PER DAY, DECREASE INTAKE OF  RED MEAT, AND TO INCREASE FISH INTAKE TO TWO DAYS PER WEEK.  MEATS/FISH SHOULD NOT BE FRIED, BAKED OR BROILED IS PREFERABLE.  IT IS ALSO IMPORTANT TO CUT BACK ON YOUR SUGAR INTAKE. PLEASE AVOID ANYTHING WITH ADDED SUGAR, CORN SYRUP OR OTHER SWEETENERS. IF YOU MUST USE A SWEETENER, YOU CAN TRY STEVIA. IT IS ALSO IMPORTANT TO AVOID ARTIFICIALLY SWEETENERS AND DIET BEVERAGES. LASTLY, I SUGGEST WEARING SPF 50 SUNSCREEN ON EXPOSED PARTS AND ESPECIALLY WHEN IN THE DIRECT SUNLIGHT FOR AN EXTENDED PERIOD OF TIME.  PLEASE AVOID FAST FOOD RESTAURANTS AND INCREASE YOUR WATER INTAKE.  - CBC - CMP14+EGFR - Lipid panel  2. Essential hypertension, benign Comments: Uncontrolled. EKG performed, NSR w/o acute changes. ADvised to follow low sodium diet. Upon further questioning, she admits to eating alot of soup. She is advised to cut back, and to use low sodium varieties. I will continue with current meds as listed below. She will f/u in 4-6 months for re-evaluation. - POCT Urinalysis Dipstick (81002) - POCT UA - Microalbumin - EKG 12-Lead - valsartan (DIOVAN) 160 MG tablet; Take 1 tablet (160 mg total) by mouth daily.  Dispense: 90 tablet; Refill: 1 - amLODipine (NORVASC) 2.5 MG tablet; Take 1 tablet (2.5 mg total) by mouth daily. Take with evening meal  Dispense: 30 tablet; Refill: 11  3. Breast cancer, stage 1,  estrogen receptor positive, left ([place]) Comments: She is s/p mastectomy. Unfortunately,not taking anastrazole. Encouraged to schedule f/u appt with Dr. Jana Hakim, Oncology. Last appt was in 2019.   4. Goiter Comments: I will check thyroid ultrasound. I will also check TSh today.  - US THYROID; Future - TSH  5. Class 2 severe obesity due to excess calories with serious comorbidity and body mass index (BMI) of 35.0 to 35.9 in adult [facility]) She is encouraged to strive for BMI less than 30 to decrease cardiac risk. Advised to aim for at least 150 minutes of exercise per week.  Patient was given opportunity to ask questions. Patient verbalized understanding of the plan and was able to repeat key elements of the plan. All questions were answered to their satisfaction.   I, Maximino Greenland, MD, have reviewed all documentation for this visit. The documentation on 03/04/21 for the exam, diagnosis, procedures, and orders are all accurate and complete.  THE PATIENT IS ENCOURAGED TO PRACTICE SOCIAL DISTANCING DUE TO THE COVID-19 PANDEMIC.

## 2021-03-04 NOTE — Patient Instructions (Signed)
Health Maintenance, Female Adopting a healthy lifestyle and getting preventive care are important in promoting health and wellness. Ask your health care provider about:  The right schedule for you to have regular tests and exams.  Things you can do on your own to prevent diseases and keep yourself healthy. What should I know about diet, weight, and exercise? Eat a healthy diet  Eat a diet that includes plenty of vegetables, fruits, low-fat dairy products, and lean protein.  Do not eat a lot of foods that are high in solid fats, added sugars, or sodium.   Maintain a healthy weight Body mass index (BMI) is used to identify weight problems. It estimates body fat based on height and weight. Your health care provider can help determine your BMI and help you achieve or maintain a healthy weight. Get regular exercise Get regular exercise. This is one of the most important things you can do for your health. Most adults should:  Exercise for at least 150 minutes each week. The exercise should increase your heart rate and make you sweat (moderate-intensity exercise).  Do strengthening exercises at least twice a week. This is in addition to the moderate-intensity exercise.  Spend less time sitting. Even light physical activity can be beneficial. Watch cholesterol and blood lipids Have your blood tested for lipids and cholesterol at 63 years of age, then have this test every 5 years. Have your cholesterol levels checked more often if:  Your lipid or cholesterol levels are high.  You are older than 63 years of age.  You are at high risk for heart disease. What should I know about cancer screening? Depending on your health history and family history, you may need to have cancer screening at various ages. This may include screening for:  Breast cancer.  Cervical cancer.  Colorectal cancer.  Skin cancer.  Lung cancer. What should I know about heart disease, diabetes, and high blood  pressure? Blood pressure and heart disease  High blood pressure causes heart disease and increases the risk of stroke. This is more likely to develop in people who have high blood pressure readings, are of African descent, or are overweight.  Have your blood pressure checked: ? Every 3-5 years if you are 18-39 years of age. ? Every year if you are 40 years old or older. Diabetes Have regular diabetes screenings. This checks your fasting blood sugar level. Have the screening done:  Once every three years after age 40 if you are at a normal weight and have a low risk for diabetes.  More often and at a younger age if you are overweight or have a high risk for diabetes. What should I know about preventing infection? Hepatitis B If you have a higher risk for hepatitis B, you should be screened for this virus. Talk with your health care provider to find out if you are at risk for hepatitis B infection. Hepatitis C Testing is recommended for:  Everyone born from 1945 through 1965.  Anyone with known risk factors for hepatitis C. Sexually transmitted infections (STIs)  Get screened for STIs, including gonorrhea and chlamydia, if: ? You are sexually active and are younger than 63 years of age. ? You are older than 63 years of age and your health care provider tells you that you are at risk for this type of infection. ? Your sexual activity has changed since you were last screened, and you are at increased risk for chlamydia or gonorrhea. Ask your health care provider   if you are at risk.  Ask your health care provider about whether you are at high risk for HIV. Your health care provider may recommend a prescription medicine to help prevent HIV infection. If you choose to take medicine to prevent HIV, you should first get tested for HIV. You should then be tested every 3 months for as long as you are taking the medicine. Pregnancy  If you are about to stop having your period (premenopausal) and  you may become pregnant, seek counseling before you get pregnant.  Take 400 to 800 micrograms (mcg) of folic acid every day if you become pregnant.  Ask for birth control (contraception) if you want to prevent pregnancy. Osteoporosis and menopause Osteoporosis is a disease in which the bones lose minerals and strength with aging. This can result in bone fractures. If you are 65 years old or older, or if you are at risk for osteoporosis and fractures, ask your health care provider if you should:  Be screened for bone loss.  Take a calcium or vitamin D supplement to lower your risk of fractures.  Be given hormone replacement therapy (HRT) to treat symptoms of menopause. Follow these instructions at home: Lifestyle  Do not use any products that contain nicotine or tobacco, such as cigarettes, e-cigarettes, and chewing tobacco. If you need help quitting, ask your health care provider.  Do not use street drugs.  Do not share needles.  Ask your health care provider for help if you need support or information about quitting drugs. Alcohol use  Do not drink alcohol if: ? Your health care provider tells you not to drink. ? You are pregnant, may be pregnant, or are planning to become pregnant.  If you drink alcohol: ? Limit how much you use to 0-1 drink a day. ? Limit intake if you are breastfeeding.  Be aware of how much alcohol is in your drink. In the [place] one drink equals one 12 oz bottle of beer (355 mL), one 5 oz glass of wine (148 mL), or one 1 oz glass of hard liquor (44 mL). General instructions  Schedule regular health, dental, and eye exams.  Stay current with your vaccines.  Tell your health care provider if: ? You often feel depressed. ? You have ever been abused or do not feel safe at home. Summary  Adopting a healthy lifestyle and getting preventive care are important in promoting health and wellness.  Follow your health care provider's instructions about healthy  diet, exercising, and getting tested or screened for diseases.  Follow your health care provider's instructions on monitoring your cholesterol and blood pressure. This information is not intended to replace advice given to you by your health care provider. Make sure you discuss any questions you have with your health care provider. Document Revised: 10/11/2018 Document Reviewed: 10/11/2018 Elsevier Patient Education  2021 [employer]  

## 2021-03-05 LAB — CMP14+EGFR
ALT: 21 IU/L (ref 0–32)
AST: 17 IU/L (ref 0–40)
Albumin/Globulin Ratio: 1.7 (ref 1.2–2.2)
Albumin: 4.2 g/dL (ref 3.8–4.8)
Alkaline Phosphatase: 71 IU/L (ref 44–121)
BUN/Creatinine Ratio: 12 (ref 12–28)
BUN: 12 mg/dL (ref 8–27)
Bilirubin Total: 0.9 mg/dL (ref 0.0–1.2)
CO2: 22 mmol/L (ref 20–29)
Calcium: 9.2 mg/dL (ref 8.7–10.3)
Chloride: 104 mmol/L (ref 96–106)
Creatinine, Ser: 0.98 mg/dL (ref 0.57–1.00)
Globulin, Total: 2.5 g/dL (ref 1.5–4.5)
Glucose: 89 mg/dL (ref 65–99)
Potassium: 4.1 mmol/L (ref 3.5–5.2)
Sodium: 141 mmol/L (ref 134–144)
Total Protein: 6.7 g/dL (ref 6.0–8.5)
eGFR: 65 mL/min/{1.73_m2} (ref >59)

## 2021-03-05 LAB — LIPID PANEL
Chol/HDL Ratio: 4.4 ratio (ref 0.0–4.4)
Cholesterol, Total: 212 mg/dL — ABNORMAL HIGH (ref 100–199)
HDL: 48 mg/dL (ref >39)
LDL Chol Calc (NIH): 157 mg/dL — ABNORMAL HIGH (ref 0–99)
Triglycerides: 43 mg/dL (ref 0–149)
VLDL Cholesterol Cal: 7 mg/dL (ref 5–40)

## 2021-03-05 LAB — CBC
Hematocrit: 39.7 % (ref 34.0–46.6)
Hemoglobin: 13.2 g/dL (ref 11.1–15.9)
MCH: 29.3 pg (ref 26.6–33.0)
MCHC: 33.2 g/dL (ref 31.5–35.7)
MCV: 88 fL (ref 79–97)
Platelets: 210 10*3/uL (ref 150–450)
RBC: 4.5 x10E6/uL (ref 3.77–5.28)
RDW: 12.5 % (ref 11.7–15.4)
WBC: 4.2 10*3/uL (ref 3.4–10.8)

## 2021-03-05 LAB — TSH: TSH: 0.78 u[IU]/mL (ref 0.450–4.500)

## 2021-03-11 ENCOUNTER — Encounter: Payer: Self-pay | Admitting: Internal Medicine

## 2021-03-12 ENCOUNTER — Ambulatory Visit
Admission: RE | Admit: 2021-03-12 | Discharge: 2021-03-12 | Disposition: A | Discharge status: Home / Self Care | Payer: BC Managed Care – PPO | Source: Ambulatory Visit | Attending: Internal Medicine | Admitting: Internal Medicine

## 2021-03-12 DIAGNOSIS — E049 Nontoxic goiter, unspecified: Secondary | ICD-10-CM

## 2021-03-14 ENCOUNTER — Encounter: Payer: Self-pay | Admitting: Internal Medicine

## 2021-03-17 ENCOUNTER — Other Ambulatory Visit: Payer: Self-pay

## 2021-03-17 ENCOUNTER — Ambulatory Visit: Payer: BC Managed Care – PPO

## 2021-03-17 VITALS — BP 128/82 | HR 67 | Temp 98.2°F | Ht 61.4 in | Wt 193.0 lb

## 2021-03-17 DIAGNOSIS — I10 Essential (primary) hypertension: Secondary | ICD-10-CM

## 2021-03-17 NOTE — Progress Notes (Signed)
Patient presents today for a blood pressure check. Today her blood pressure was 128/82. YL,RMA

## 2021-03-27 ENCOUNTER — Other Ambulatory Visit: Payer: Self-pay | Admitting: Internal Medicine

## 2021-03-27 DIAGNOSIS — E041 Nontoxic single thyroid nodule: Secondary | ICD-10-CM

## 2021-04-02 ENCOUNTER — Ambulatory Visit
Admission: RE | Admit: 2021-04-02 | Discharge: 2021-04-02 | Disposition: A | Discharge status: Home / Self Care | Payer: BC Managed Care – PPO | Source: Ambulatory Visit | Attending: Internal Medicine | Admitting: Internal Medicine

## 2021-04-02 ENCOUNTER — Other Ambulatory Visit (HOSPITAL_COMMUNITY)
Admission: RE | Admit: 2021-04-02 | Discharge: 2021-04-02 | Disposition: A | Discharge status: Home / Self Care | Payer: BC Managed Care – PPO | Source: Ambulatory Visit | Attending: Radiology | Admitting: Radiology

## 2021-04-02 DIAGNOSIS — E041 Nontoxic single thyroid nodule: Secondary | ICD-10-CM

## 2021-04-03 LAB — CYTOLOGY - NON PAP

## 2021-04-15 ENCOUNTER — Ambulatory Visit: Payer: BC Managed Care – PPO | Admitting: Internal Medicine

## 2021-04-23 ENCOUNTER — Encounter (HOSPITAL_COMMUNITY): Payer: Self-pay

## 2021-05-12 ENCOUNTER — Encounter: Payer: Self-pay | Admitting: Internal Medicine

## 2021-07-01 IMAGING — US US FNA BIOPSY THYROID 1ST LESION
1 series · 13 of 25 positions shown · non-contrast
Comparison: Thyroid ultrasound dated 03/13/2021

INDICATION: Patient with history of left breast cancer in 3681 along with
thyroid goiter. Thyroid ultrasound performed on 03/13/2021 revealed
3.1 cm left mid and 2.5 cm left inferior nodules which meet criteria
for biopsy. She presents today for the procedures.

EXAM:
ULTRASOUND GUIDED FINE NEEDLE ASPIRATION BIOPSIES OF LEFT MID AND
LEFT INFERIOR THYROID NODULES
TECHNIQUE: Informed written consent was obtained from the patient after a
discussion of the risks, benefits and alternatives to treatment.
Questions regarding the procedure were encouraged and answered. A
timeout was performed prior to the initiation of the procedure.

[Series 1: us fna biopsy thyroid 1st lesion · 0.08mm/px · 34 acquisitions, 13 frames shown]
[im 1/34]
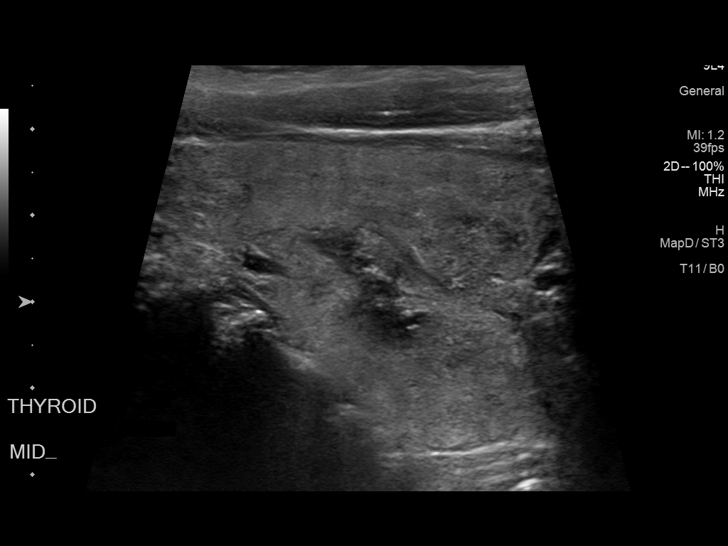
[im 3/34]
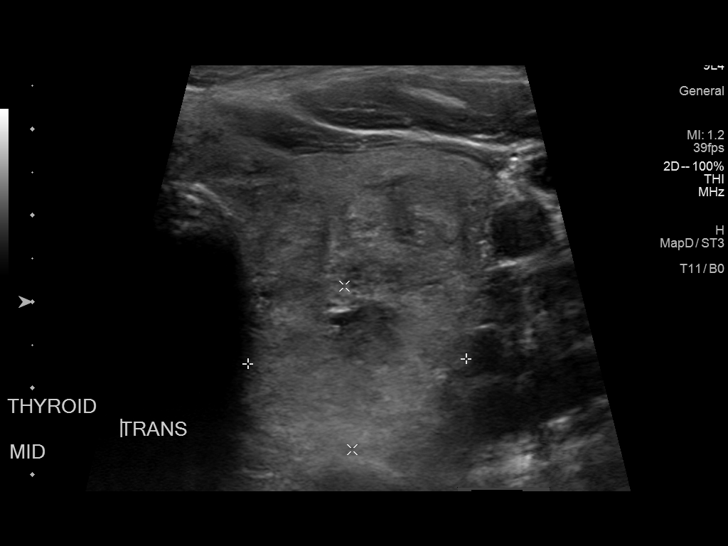
[im 6/34]
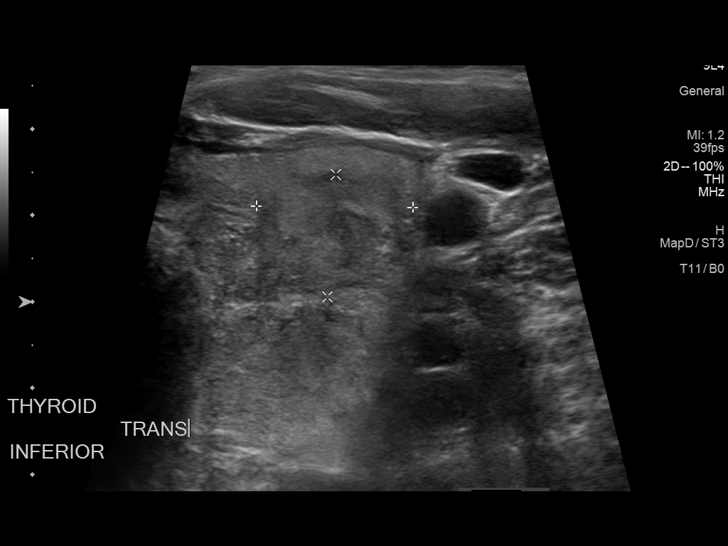
[im 9/34]
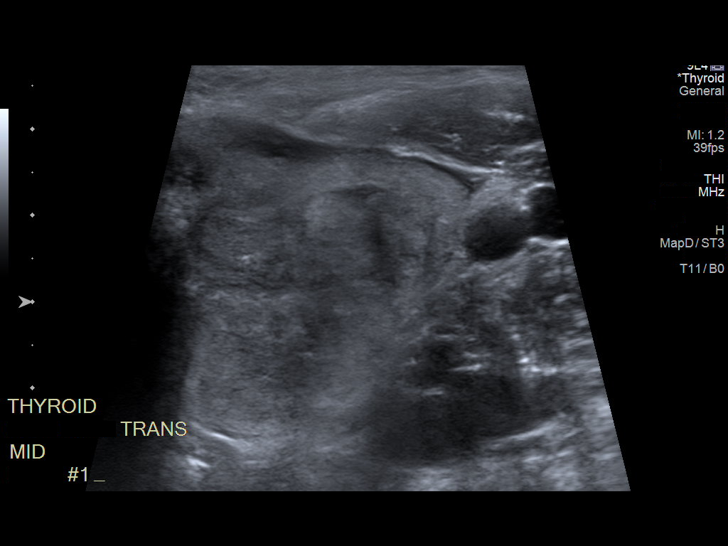
[im 12/34]
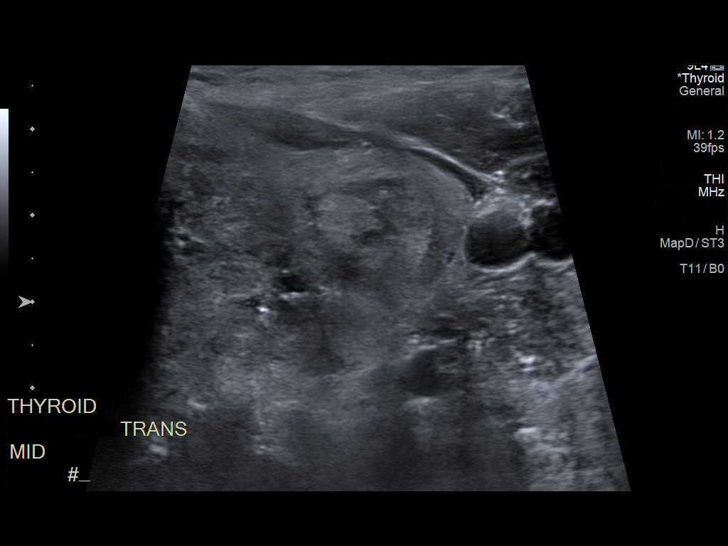
[im 14/34]
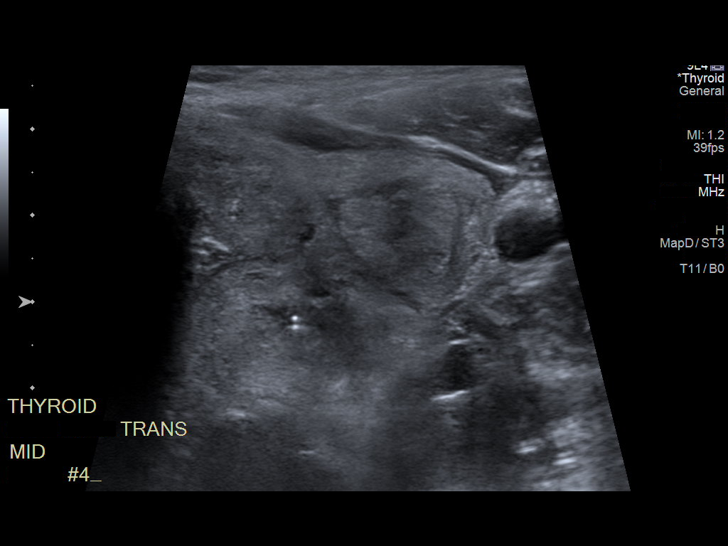
[im 17/34]
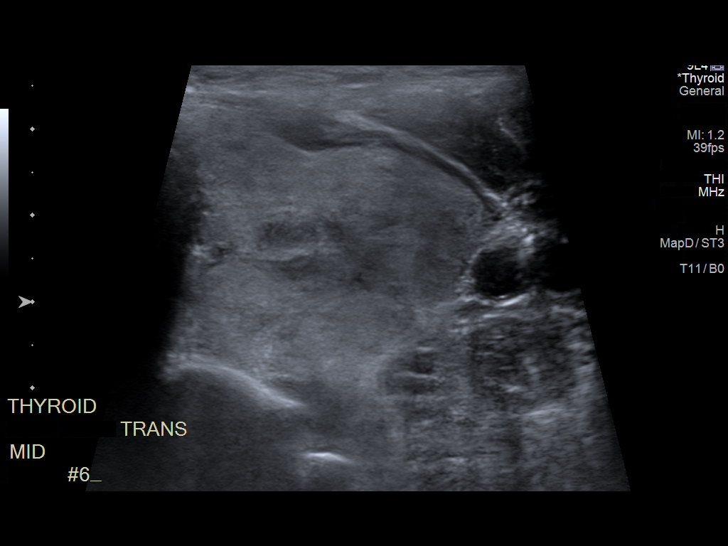
[im 20/34]
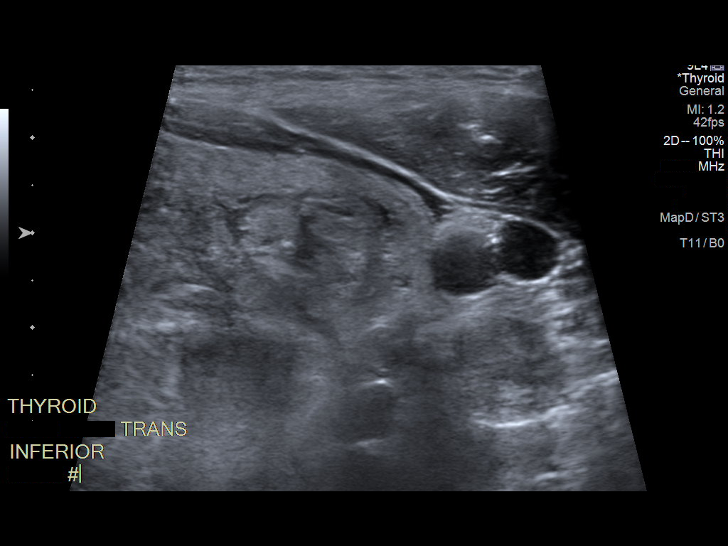
[im 23/34]
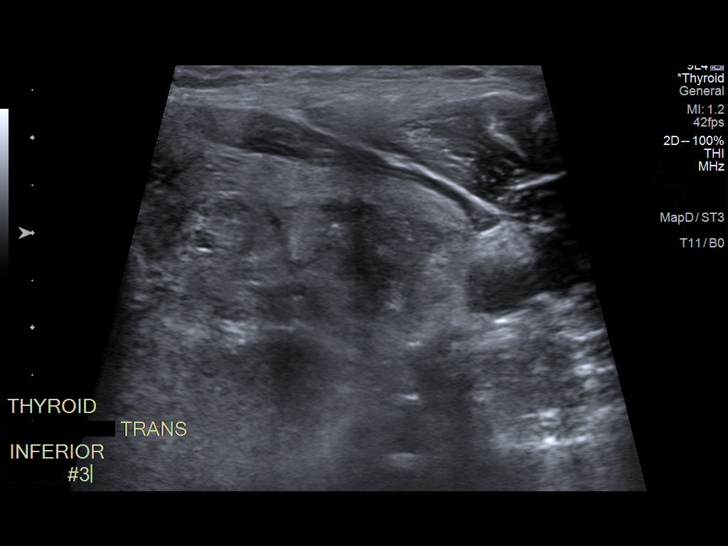
[im 25/34]
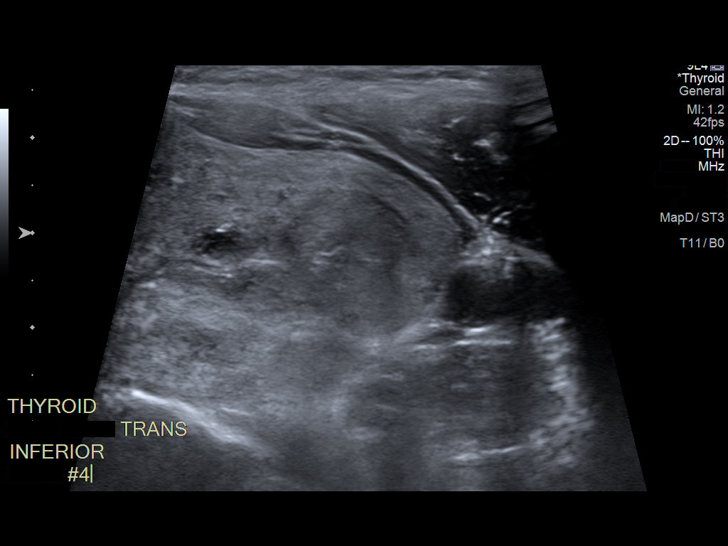
[im 28/34]
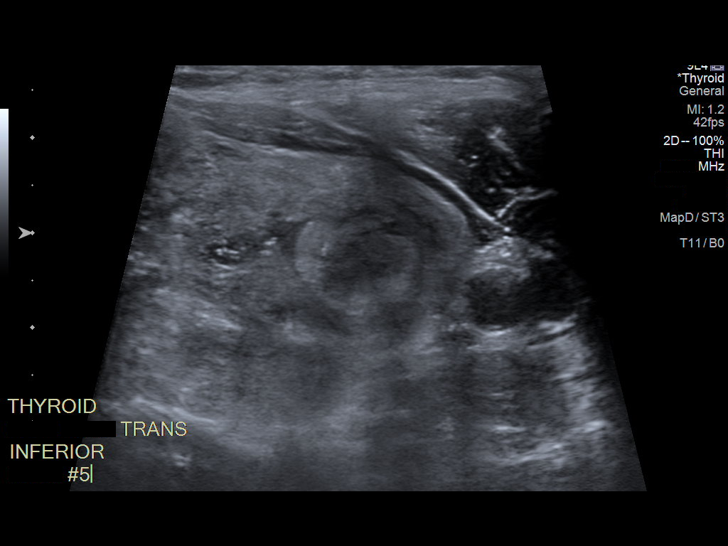
[im 31/34]
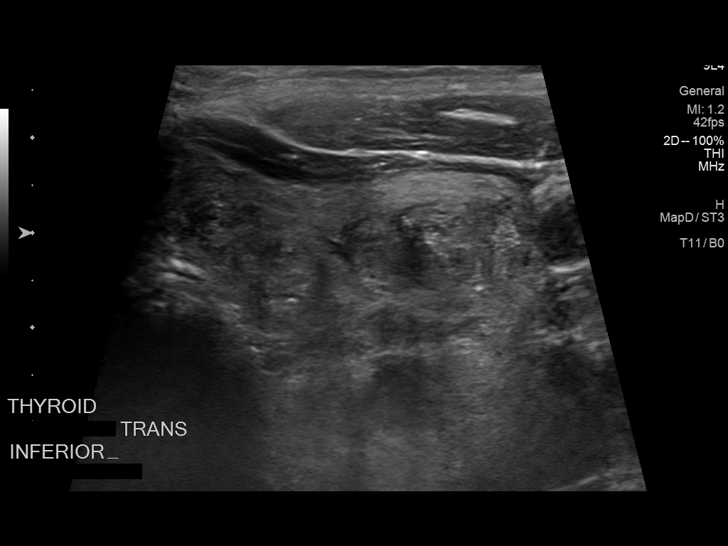
[im 34/34]
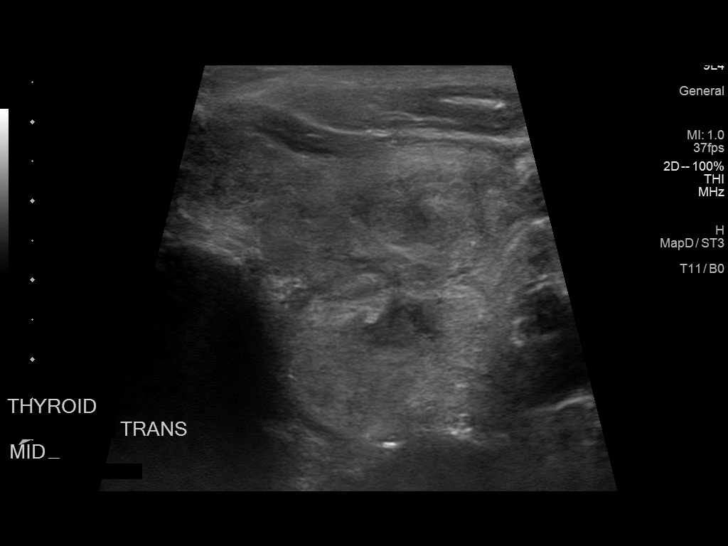

[13 of 25 positions shown; findings below may reference images not displayed]

MEDICATIONS:
1% lidocaine to skin and subcutaneous tissue

COMPLICATIONS:
None immediate.
Pre-procedural ultrasound scanning demonstrated unchanged size and
appearance of the indeterminate nodules within the left mid and
inferior thyroid lobes

The procedure was planned. The neck was prepped in the usual sterile
fashion, and a sterile drape was applied covering the operative
field. A timeout was performed prior to the initiation of the
procedure. Local anesthesia was provided with 1% lidocaine.

Under direct ultrasound guidance, 6 FNA biopsies were performed of
the left mid thyroid nodule with 27 gauge needle. Multiple
ultrasound images were saved for procedural documentation purposes.
The samples were prepared and submitted to pathology as well as for
Afirma testing.

Under direct ultrasound guidance, 6 FNA biopsies were performed of
the left inferior thyroid nodule with 27 gauge needles. Multiple
ultrasound images were saved for procedural documentation purposes.
The samples were prepared and submitted to pathology as well as for
Afirma testing.

Limited post procedural scanning was negative for hematoma or
additional complication. Dressings were placed. The patient
tolerated the above procedures procedure well without immediate
postprocedural complication.
FINDINGS: Nodule reference number based on prior diagnostic ultrasound: 4

Maximum size: 3.1 cm

Location: Left; Mid

ACR TI-RADS risk category: TR3 (3 points)

Reason for biopsy: meets ACR TI-RADS criteria

_________________________________________________________

Nodule reference number based on prior diagnostic ultrasound: 5

Maximum size: 2.5 cm

Location: Left; Inferior

ACR TI-RADS risk category: TR4 (4-6 points)

Reason for biopsy: meets ACR TI-RADS criteria

Ultrasound imaging confirms appropriate placement of the needles
within the thyroid nodules.
IMPRESSION: 1. Technically successful ultrasound guided fine needle aspiration
biopsy of left mid thyroid nodule.
2. Technically successful ultrasound guided fine needle aspiration
biopsy of left inferior thyroid nodule. Final pathology pending.

## 2021-08-11 ENCOUNTER — Encounter: Payer: Self-pay | Admitting: Internal Medicine

## 2021-08-11 ENCOUNTER — Other Ambulatory Visit: Payer: Self-pay

## 2021-08-11 ENCOUNTER — Other Ambulatory Visit: Payer: Self-pay | Admitting: Internal Medicine

## 2021-08-11 DIAGNOSIS — I10 Essential (primary) hypertension: Secondary | ICD-10-CM

## 2021-08-11 MED ORDER — VALSARTAN 160 MG PO TABS: 160.0000 mg | ORAL_TABLET | Freq: Every day | ORAL | 0 refills | Status: DC

## 2021-09-17 ENCOUNTER — Encounter: Payer: Self-pay | Admitting: Internal Medicine

## 2021-09-17 ENCOUNTER — Ambulatory Visit: Payer: BC Managed Care – PPO | Admitting: Internal Medicine

## 2021-09-17 ENCOUNTER — Other Ambulatory Visit: Payer: Self-pay

## 2021-09-17 VITALS — BP 122/76 | HR 68 | Temp 98.5°F | Ht 61.2 in | Wt 192.8 lb

## 2021-09-17 DIAGNOSIS — I10 Essential (primary) hypertension: Secondary | ICD-10-CM

## 2021-09-17 DIAGNOSIS — Z6836 Body mass index (BMI) 36.0-36.9, adult: Secondary | ICD-10-CM | POA: Diagnosis not present

## 2021-09-17 DIAGNOSIS — Z2821 Immunization not carried out because of patient refusal: Secondary | ICD-10-CM | POA: Diagnosis not present

## 2021-09-17 LAB — CMP14+EGFR
ALT: 14 IU/L (ref 0–32)
AST: 18 IU/L (ref 0–40)
Albumin/Globulin Ratio: 1.6 (ref 1.2–2.2)
Albumin: 4.4 g/dL (ref 3.8–4.8)
Alkaline Phosphatase: 69 IU/L (ref 44–121)
BUN/Creatinine Ratio: 13 (ref 12–28)
BUN: 12 mg/dL (ref 8–27)
Bilirubin Total: 1.1 mg/dL (ref 0.0–1.2)
CO2: 25 mmol/L (ref 20–29)
Calcium: 9.6 mg/dL (ref 8.7–10.3)
Chloride: 103 mmol/L (ref 96–106)
Creatinine, Ser: 0.91 mg/dL (ref 0.57–1.00)
Globulin, Total: 2.7 g/dL (ref 1.5–4.5)
Glucose: 90 mg/dL (ref 70–99)
Potassium: 4.4 mmol/L (ref 3.5–5.2)
Sodium: 140 mmol/L (ref 134–144)
Total Protein: 7.1 g/dL (ref 6.0–8.5)
eGFR: 71 mL/min/{1.73_m2} (ref >59)

## 2021-09-17 MED ORDER — VALSARTAN 160 MG PO TABS: ORAL_TABLET | ORAL | 1 refills | Status: DC

## 2021-09-17 NOTE — Progress Notes (Signed)
Jeri Cos Llittleton,acting as a Neurosurgeon for Gwynneth Aliment, MD.,have documented all relevant documentation on the behalf of Gwynneth Aliment, MD,as directed by  Gwynneth Aliment, MD while in the presence of Gwynneth Aliment, MD.  This visit occurred during the SARS-CoV-2 public health emergency.  Safety protocols were in place, including screening questions prior to the visit, additional usage of staff PPE, and extensive cleaning of exam room while observing appropriate contact time as indicated for disinfecting solutions.  Subjective:     Patient ID: Rita Roberts , female    DOB: October 28, 1956 , 65 y.o.   MRN: [id number]   Chief Complaint  Patient presents with   Hypertension    HPI  The patient is here today for a follow-up on her blood pressure.  She reports compliance with her medications.  She denies headaches, chest pain and shortness of breath.   Hypertension This is a chronic problem. The current episode started more than 1 year ago. The problem has been gradually improving since onset. The problem is uncontrolled. Pertinent negatives include no blurred vision, chest pain, palpitations or shortness of breath. Risk factors for coronary artery disease include post-menopausal state. Past treatments include angiotensin blockers. The current treatment provides moderate improvement.    Past Medical History:  Diagnosis Date   Anemia    Arthritis    "probably in left shoulder" (02/09/2018)   Breast cancer, left breast ([facility]) 11/2017   Family history of adverse reaction to anesthesia    "my mom may have; not sure" (02/09/2018)   GERD (gastroesophageal reflux disease)    Heartburn    Pneumonia 1960     Family History  Problem Relation Age of Onset   Dementia Mother    Lung cancer Father    Healthy Sister      Current Outpatient Medications:    ibuprofen (ADVIL,MOTRIN) 200 MG tablet, Take 400 mg by mouth daily as needed for headache or moderate pain., Disp: , Rfl:    Nutritional  Supplements (MENOPAUSE FORMULA) TABS, Take 1 tablet by mouth daily., Disp: , Rfl:    valsartan (DIOVAN) 160 MG tablet, TAKE 1 TABLET(160 MG) BY MOUTH DAILY, Disp: 90 tablet, Rfl: 1   No Known Allergies   Review of Systems  Constitutional: Negative.   Eyes:  Negative for blurred vision.  Respiratory: Negative.  Negative for shortness of breath.   Cardiovascular: Negative.  Negative for chest pain and palpitations.  Gastrointestinal: Negative.   Neurological: Negative.   Psychiatric/Behavioral: Negative.      Today's Vitals   09/17/21 1052  BP: 122/76  Pulse: 68  Temp: 98.5 F (36.9 C)  Weight: 192 lb 12.8 oz (87.5 kg)  Height: 5' 1.2" (1.554 m)  PainSc: 0-No pain   Body mass index is 36.19 kg/m.  Wt Readings from Last 3 Encounters:  09/17/21 192 lb 12.8 oz (87.5 kg)  03/17/21 193 lb (87.5 kg)  03/04/21 193 lb (87.5 kg)    Objective:  Physical Exam Vitals and nursing note reviewed.  Constitutional:      Appearance: Normal appearance. She is obese.  HENT:     Head: Normocephalic and atraumatic.     Nose:     Comments: Masked     Mouth/Throat:     Comments: Masked  Eyes:     Extraocular Movements: Extraocular movements intact.  Cardiovascular:     Rate and Rhythm: Normal rate and regular rhythm.     Heart sounds: Normal heart sounds.  Pulmonary:  Effort: Pulmonary effort is normal.     Breath sounds: Normal breath sounds.  Musculoskeletal:     Cervical back: Normal range of motion.  Skin:    General: Skin is warm.  Neurological:     General: No focal deficit present.     Mental Status: She is alert.  Psychiatric:        Mood and Affect: Mood normal.        Behavior: Behavior normal.        Assessment And Plan:     1. Essential hypertension, benign Comments: Chronic, well controlled. NO med changes. She was congratulated on her dietary changes. She will f/u in six months for CPE. I will check renal function today.  - CMP14+EGFR - valsartan (DIOVAN)  160 MG tablet; TAKE 1 TABLET(160 MG) BY MOUTH DAILY  Dispense: 90 tablet; Refill: 1  2. Class 2 severe obesity due to excess calories with serious comorbidity and body mass index (BMI) of 36.0 to 36.9 in adult [facility]) Comments: She is encouraged to strive for BMI less than 30 to decrease cardiac risk. Advised to aim for at least 150 minutes of exercise per week.   3. Influenza vaccination declined   Patient was given opportunity to ask questions. Patient verbalized understanding of the plan and was able to repeat key elements of the plan. All questions were answered to their satisfaction.   I, Maximino Greenland, MD, have reviewed all documentation for this visit. The documentation on 09/17/21 for the exam, diagnosis, procedures, and orders are all accurate and complete.   IF YOU HAVE BEEN REFERRED TO A SPECIALIST, IT MAY TAKE 1-2 WEEKS TO SCHEDULE/PROCESS THE REFERRAL. IF YOU HAVE NOT HEARD FROM US/SPECIALIST IN TWO WEEKS, PLEASE GIVE [place] A CALL AT [phone].   THE PATIENT IS ENCOURAGED TO PRACTICE SOCIAL DISTANCING DUE TO THE COVID-19 PANDEMIC.

## 2021-09-17 NOTE — Patient Instructions (Signed)

## 2022-03-11 ENCOUNTER — Ambulatory Visit (INDEPENDENT_AMBULATORY_CARE_PROVIDER_SITE_OTHER): Payer: BC Managed Care – PPO | Admitting: Internal Medicine

## 2022-03-11 ENCOUNTER — Encounter: Payer: Self-pay | Admitting: Internal Medicine

## 2022-03-11 VITALS — BP 132/80 | HR 63 | Temp 98.6°F | Ht 61.2 in | Wt 197.0 lb

## 2022-03-11 DIAGNOSIS — Z0000 Encounter for general adult medical examination without abnormal findings: Secondary | ICD-10-CM | POA: Diagnosis not present

## 2022-03-11 DIAGNOSIS — Z6836 Body mass index (BMI) 36.0-36.9, adult: Secondary | ICD-10-CM

## 2022-03-11 DIAGNOSIS — C50912 Malignant neoplasm of unspecified site of left female breast: Secondary | ICD-10-CM

## 2022-03-11 DIAGNOSIS — I10 Essential (primary) hypertension: Secondary | ICD-10-CM | POA: Diagnosis not present

## 2022-03-11 DIAGNOSIS — Z170 Estrogen receptor positive status [ER+]: Secondary | ICD-10-CM

## 2022-03-11 DIAGNOSIS — E66812 Obesity, class 2: Secondary | ICD-10-CM

## 2022-03-11 DIAGNOSIS — Z23 Encounter for immunization: Secondary | ICD-10-CM

## 2022-03-11 LAB — POCT URINALYSIS DIPSTICK
Bilirubin, UA: NEGATIVE
Blood, UA: NEGATIVE
Glucose, UA: NEGATIVE
Ketones, UA: NEGATIVE
Leukocytes, UA: NEGATIVE
Nitrite, UA: NEGATIVE
Protein, UA: NEGATIVE
Spec Grav, UA: 1.01 (ref 1.010–1.025)
Urobilinogen, UA: 0.2 E.U./dL
pH, UA: 6.5 (ref 5.0–8.0)

## 2022-03-11 MED ORDER — VALSARTAN 160 MG PO TABS: ORAL_TABLET | ORAL | 1 refills | Status: DC

## 2022-03-11 NOTE — Progress Notes (Signed)
Rich Brave Llittleton,acting as a Education administrator for Maximino Greenland, MD.,have documented all relevant documentation on the behalf of Maximino Greenland, MD,as directed by  Maximino Greenland, MD while in the presence of Maximino Greenland, MD.  This visit occurred during the SARS-CoV-2 public health emergency.  Safety protocols were in place, including screening questions prior to the visit, additional usage of staff PPE, and extensive cleaning of exam room while observing appropriate contact time as indicated for disinfecting solutions.  Subjective:     Patient ID: Rita Roberts , female    DOB: Jun 27, 1956 , 66 y.o.   MRN: [id number]   Chief Complaint  Patient presents with   Annual Exam   Hypertension    HPI  The patient is here for a physical examination.  That patient has her GYN exams by Dr. Crawford Givens. She reports compliance with meds. She denies headaches, chest pain and shortness of breath.   Hypertension This is a chronic problem. The current episode started more than 1 year ago. The problem has been gradually improving since onset. The problem is uncontrolled. Pertinent negatives include no blurred vision, chest pain, palpitations or shortness of breath. Risk factors for coronary artery disease include post-menopausal state. Past treatments include angiotensin blockers. The current treatment provides moderate improvement.    Past Medical History:  Diagnosis Date   Anemia    Arthritis    "probably in left shoulder" (02/09/2018)   Breast cancer, left breast ([place]) 11/2017   Family history of adverse reaction to anesthesia    "my mom may have; not sure" (02/09/2018)   GERD (gastroesophageal reflux disease)    Heartburn    Pneumonia 1960     Family History  Problem Relation Age of Onset   Dementia Mother    Lung cancer Father    Healthy Sister      Current Outpatient Medications:    ibuprofen (ADVIL,MOTRIN) 200 MG tablet, Take 400 mg by mouth daily as needed for headache or moderate  pain., Disp: , Rfl:    Nutritional Supplements (MENOPAUSE FORMULA) TABS, Take 1 tablet by mouth daily., Disp: , Rfl:    valsartan (DIOVAN) 160 MG tablet, TAKE 1 TABLET(160 MG) BY MOUTH DAILY, Disp: 90 tablet, Rfl: 1   No Known Allergies    The patient states she uses post menopausal status for birth control. Last LMP was No LMP recorded. Patient is postmenopausal.. Negative for Dysmenorrhea and Negative for Menorrhagia. Negative for: breast discharge, breast lump(s), breast pain and breast self exam. Associated symptoms include abnormal vaginal bleeding. Pertinent negatives include abnormal bleeding (hematology), anxiety, decreased libido, depression, difficulty falling sleep, dyspareunia, history of infertility, nocturia, sexual dysfunction, sleep disturbances, urinary incontinence, urinary urgency, vaginal discharge and vaginal itching. Diet regular.The patient states her exercise level is    . The patient's tobacco use is:  Social History   Tobacco Use  Smoking Status Former   Packs/day: 0.50   Years: 15.00   Pack years: 7.50   Types: Cigarettes   Quit date: 1990   Years since quitting: 33.4  Smokeless Tobacco Never  . She has been exposed to passive smoke. The patient's alcohol use is:  Social History   Substance and Sexual Activity  Alcohol Use Not Currently   Review of Systems  Constitutional: Negative.   HENT: Negative.    Eyes: Negative.  Negative for blurred vision.  Respiratory: Negative.  Negative for shortness of breath.   Cardiovascular: Negative.  Negative for chest pain and palpitations.  Gastrointestinal: Negative.   Endocrine: Negative.   Genitourinary: Negative.   Musculoskeletal: Negative.   Skin: Negative.   Allergic/Immunologic: Negative.   Neurological: Negative.   Hematological: Negative.   Psychiatric/Behavioral: Negative.      Today's Vitals   03/11/22 0826  BP: 132/80  Pulse: 63  Temp: 98.6 F (37 C)  Weight: 197 lb (89.4 kg)  Height: 5' 1.2"  (1.554 m)  PainSc: 0-No pain   Body mass index is 36.98 kg/m.  Wt Readings from Last 3 Encounters:  03/11/22 197 lb (89.4 kg)  09/17/21 192 lb 12.8 oz (87.5 kg)  03/17/21 193 lb (87.5 kg)     Objective:  Physical Exam Vitals and nursing note reviewed.  Constitutional:      Appearance: Normal appearance. She is obese.  HENT:     Head: Normocephalic and atraumatic.     Right Ear: Tympanic membrane, ear canal and external ear normal.     Left Ear: Tympanic membrane, ear canal and external ear normal.     Mouth/Throat:     Mouth: Mucous membranes are moist.     Pharynx: Oropharynx is clear.  Eyes:     Extraocular Movements: Extraocular movements intact.     Conjunctiva/sclera: Conjunctivae normal.     Pupils: Pupils are equal, round, and reactive to light.  Cardiovascular:     Rate and Rhythm: Normal rate and regular rhythm.     Pulses: Normal pulses.     Heart sounds: Normal heart sounds.  Pulmonary:     Effort: Pulmonary effort is normal.     Breath sounds: Normal breath sounds.  Chest:  Breasts:    Tanner Score is 5.     Right: Normal.     Left: Absent.     Comments: Mastectomy on left Abdominal:     General: Bowel sounds are normal.     Palpations: Abdomen is soft.  Genitourinary:    Comments: deferred Musculoskeletal:        General: Normal range of motion.     Cervical back: Normal range of motion and neck supple.  Skin:    General: Skin is warm and dry.  Neurological:     General: No focal deficit present.     Mental Status: She is alert and oriented to person, place, and time.  Psychiatric:        Mood and Affect: Mood normal.        Behavior: Behavior normal.     Assessment And Plan:     1. Encounter for general adult medical examination w/o abnormal findings Comments: A full exam was performed. Importance of monthly self breast exams was discussed with the patient. PATIENT IS ADVISED TO GET 30-45 MINUTES REGULAR EXERCISE NO LESS THAN FOUR TO FIVE DAYS  PER WEEK - BOTH WEIGHTBEARING EXERCISES AND AEROBIC ARE RECOMMENDED.  PATIENT IS ADVISED TO FOLLOW A HEALTHY DIET WITH AT LEAST SIX FRUITS/VEGGIES PER DAY, DECREASE INTAKE OF RED MEAT, AND TO INCREASE FISH INTAKE TO TWO DAYS PER WEEK.  MEATS/FISH SHOULD NOT BE FRIED, BAKED OR BROILED IS PREFERABLE.  IT IS ALSO IMPORTANT TO CUT BACK ON YOUR SUGAR INTAKE. PLEASE AVOID ANYTHING WITH ADDED SUGAR, CORN SYRUP OR OTHER SWEETENERS. IF YOU MUST USE A SWEETENER, YOU CAN TRY STEVIA. IT IS ALSO IMPORTANT TO AVOID ARTIFICIALLY SWEETENERS AND DIET BEVERAGES. LASTLY, I SUGGEST WEARING SPF 50 SUNSCREEN ON EXPOSED PARTS AND ESPECIALLY WHEN IN THE DIRECT SUNLIGHT FOR AN EXTENDED PERIOD OF TIME.  PLEASE AVOID FAST FOOD RESTAURANTS  AND INCREASE YOUR WATER INTAKE. - CMP14+EGFR - CBC - Lipid panel - Insulin, random(561)  2. Essential hypertension, benign Comments: Chronic, fair control. Goal <130/80. EKG performed, NSR w/o acute changes.Advised to follow low sodium diet.  - POCT Urinalysis Dipstick (81002) - Microalbumin / Creatinine Urine Ratio - EKG 12-Lead - valsartan (DIOVAN) 160 MG tablet; TAKE 1 TABLET(160 MG) BY MOUTH DAILY  Dispense: 90 tablet; Refill: 1  3. Breast cancer, stage 1, estrogen receptor positive, left ([facility]) Comments: Originally diagnosed in 2019, s/p mastectomy. Stopped anastrazole due to hot flashes.  4. Class 2 severe obesity due to excess calories with serious comorbidity and body mass index (BMI) of 36.0 to 36.9 in adult [facility]) Comments: She is encouraged to aim for at least 150 minutes of exercise per week, while striving for BMI<30 to decrease cardiac risk.   5. Immunization due - Varicella-zoster vaccine IM (Shingrix)   Patient was given opportunity to ask questions. Patient verbalized understanding of the plan and was able to repeat key elements of the plan. All questions were answered to their satisfaction.   I, Maximino Greenland, MD, have reviewed all documentation for this visit. The  documentation on 03/11/22 for the exam, diagnosis, procedures, and orders are all accurate and complete.   THE PATIENT IS ENCOURAGED TO PRACTICE SOCIAL DISTANCING DUE TO THE COVID-19 PANDEMIC.

## 2022-03-11 NOTE — Patient Instructions (Addendum)
Coronary Calcium Scan ?A coronary calcium scan is an imaging test used to look for deposits of plaque in the inner lining of the blood vessels of the heart (coronary arteries). Plaque is made up of calcium, protein, and fatty substances. These deposits of plaque can partly clog and narrow the coronary arteries without producing any symptoms or warning signs. This puts a person at risk for a heart attack. ?A coronary calcium scan is performed using a computed tomography (CT) scanner machine without using a dye (contrast). ?This test is recommended for people who are at moderate risk for heart disease. The test can find plaque deposits before symptoms develop. ?Tell a health care provider about: ?Any allergies you have. ?All medicines you are taking, including vitamins, herbs, eye drops, creams, and over-the-counter medicines. ?Any problems you or family members have had with anesthetic medicines. ?Any bleeding problems you have. ?Any surgeries you have had. ?Any medical conditions you have. ?Whether you are pregnant or may be pregnant. ?What are the risks? ?Generally, this is a safe procedure. However, problems may occur, including: ?Harm to a pregnant woman and her unborn baby. This test involves the use of radiation. Radiation exposure can be dangerous to a pregnant woman and her unborn baby. If you are pregnant or think you may be pregnant, you should not have this procedure done. ?A slight increase in the risk of cancer. This is because of the radiation involved in the test. The amount of radiation from one test is similar to the amount of radiation you are naturally exposed to over one year. ?What happens before the procedure? ?Ask your health care provider for any specific instructions on how to prepare for this procedure. You may be asked to avoid products that contain caffeine, tobacco, or nicotine for 4 hours before the procedure. ?What happens during the procedure? ? ?You will undress and remove any jewelry  from your neck or chest. You may need to remove hearing aides and dentures. Women may need to remove their bras. ?You will put on a hospital gown. ?Sticky electrodes will be placed on your chest. The electrodes will be connected to an electrocardiogram (ECG) machine to record a tracing of the electrical activity of your heart. ?You will lie down on your back on a curved bed that is attached to the [facility]. ?You may be given medicine to slow down your heart rate so that clear pictures can be created. ?You will be moved into the CT scanner, and the CT scanner will take pictures of your heart. During this time, you will be asked to lie still and hold your breath for 10-20 seconds at a time while each picture of your heart is being taken. ?The procedure may vary among health care providers and hospitals. ?What can I expect after the procedure? ?You can return to your normal activities. ?It is up to you to get the results of your procedure. Ask your health care provider, or the department that is doing the procedure, when your results will be ready. ?Summary ?A coronary calcium scan is an imaging test used to look for deposits of plaque in the inner lining of the blood vessels of the heart. Plaque is made up of calcium, protein, and fatty substances. ?A coronary calcium scan is performed using a CT scanner machine without contrast. ?Generally, this is a safe procedure. Tell your health care provider if you are pregnant or may be pregnant. ?Ask your health care provider for any specific instructions on how to  prepare for this procedure. ?You can return to your normal activities after the scan is done. ?This information is not intended to replace advice given to you by your health care provider. Make sure you discuss any questions you have with your health care provider. ?Document Revised: 09/27/2021 Document Reviewed: 09/27/2021 ?Elsevier Patient Education ? [place]. ? ? ?Health Maintenance, Female ?Adopting  a healthy lifestyle and getting preventive care are important in promoting health and wellness. Ask your health care provider about: ?The right schedule for you to have regular tests and exams. ?Things you can do on your own to prevent diseases and keep yourself healthy. ?What should I know about diet, weight, and exercise? ?Eat a healthy diet ? ?Eat a diet that includes plenty of vegetables, fruits, low-fat dairy products, and lean protein. ?Do not eat a lot of foods that are high in solid fats, added sugars, or sodium. ?Maintain a healthy weight ?Body mass index (BMI) is used to identify weight problems. It estimates body fat based on height and weight. Your health care provider can help determine your BMI and help you achieve or maintain a healthy weight. ?Get regular exercise ?Get regular exercise. This is one of the most important things you can do for your health. Most adults should: ?Exercise for at least 150 minutes each week. The exercise should increase your heart rate and make you sweat (moderate-intensity exercise). ?Do strengthening exercises at least twice a week. This is in addition to the moderate-intensity exercise. ?Spend less time sitting. Even light physical activity can be beneficial. ?Watch cholesterol and blood lipids ?Have your blood tested for lipids and cholesterol at 64 years of age, then have this test every 5 years. ?Have your cholesterol levels checked more often if: ?Your lipid or cholesterol levels are high. ?You are older than 64 years of age. ?You are at high risk for heart disease. ?What should I know about cancer screening? ?Depending on your health history and family history, you may need to have cancer screening at various ages. This may include screening for: ?Breast cancer. ?Cervical cancer. ?Colorectal cancer. ?Skin cancer. ?Lung cancer. ?What should I know about heart disease, diabetes, and high blood pressure? ?Blood pressure and heart disease ?High blood pressure causes  heart disease and increases the risk of stroke. This is more likely to develop in people who have high blood pressure readings or are overweight. ?Have your blood pressure checked: ?Every 3-5 years if you are 37-51 years of age. ?Every year if you are 84 years old or older. ?Diabetes ?Have regular diabetes screenings. This checks your fasting blood sugar level. Have the screening done: ?Once every three years after age 85 if you are at a normal weight and have a low risk for diabetes. ?More often and at a younger age if you are overweight or have a high risk for diabetes. ?What should I know about preventing infection? ?Hepatitis B ?If you have a higher risk for hepatitis B, you should be screened for this virus. Talk with your health care provider to find out if you are at risk for hepatitis B infection. ?Hepatitis C ?Testing is recommended for: ?Everyone born from [birth year] through 1963. ?Anyone with known risk factors for hepatitis C. ?Sexually transmitted infections (STIs) ?Get screened for STIs, including gonorrhea and chlamydia, if: ?You are sexually active and are younger than 64 years of age. ?You are older than 64 years of age and your health care provider tells you that you are at risk for  this type of infection. ?Your sexual activity has changed since you were last screened, and you are at increased risk for chlamydia or gonorrhea. Ask your health care provider if you are at risk. ?Ask your health care provider about whether you are at high risk for HIV. Your health care provider may recommend a prescription medicine to help prevent HIV infection. If you choose to take medicine to prevent HIV, you should first get tested for HIV. You should then be tested every 3 months for as long as you are taking the medicine. ?Pregnancy ?If you are about to stop having your period (premenopausal) and you may become pregnant, seek counseling before you get pregnant. ?Take 400 to 800 micrograms (mcg) of folic acid every day  if you become pregnant. ?Ask for birth control (contraception) if you want to prevent pregnancy. ?Osteoporosis and menopause ?Osteoporosis is a disease in which the bones lose minerals and strength wit

## 2022-03-12 LAB — LIPID PANEL
Chol/HDL Ratio: 4.5 ratio — ABNORMAL HIGH (ref 0.0–4.4)
Cholesterol, Total: 210 mg/dL — ABNORMAL HIGH (ref 100–199)
HDL: 47 mg/dL (ref >39)
LDL Chol Calc (NIH): 155 mg/dL — ABNORMAL HIGH (ref 0–99)
Triglycerides: 48 mg/dL (ref 0–149)
VLDL Cholesterol Cal: 8 mg/dL (ref 5–40)

## 2022-03-12 LAB — CMP14+EGFR
ALT: 28 IU/L (ref 0–32)
AST: 24 IU/L (ref 0–40)
Albumin/Globulin Ratio: 1.4 (ref 1.2–2.2)
Albumin: 4.3 g/dL (ref 3.8–4.8)
Alkaline Phosphatase: 70 IU/L (ref 44–121)
BUN/Creatinine Ratio: 18 (ref 12–28)
BUN: 16 mg/dL (ref 8–27)
Bilirubin Total: 1 mg/dL (ref 0.0–1.2)
CO2: 23 mmol/L (ref 20–29)
Calcium: 9.8 mg/dL (ref 8.7–10.3)
Chloride: 104 mmol/L (ref 96–106)
Creatinine, Ser: 0.89 mg/dL (ref 0.57–1.00)
Globulin, Total: 3 g/dL (ref 1.5–4.5)
Glucose: 93 mg/dL (ref 70–99)
Potassium: 4.2 mmol/L (ref 3.5–5.2)
Sodium: 142 mmol/L (ref 134–144)
Total Protein: 7.3 g/dL (ref 6.0–8.5)
eGFR: 73 mL/min/{1.73_m2} (ref >59)

## 2022-03-12 LAB — MICROALBUMIN / CREATININE URINE RATIO
Creatinine, Urine: 21.2 mg/dL
Microalb/Creat Ratio: <14 mg/g creat (ref 0–29)
Microalbumin, Urine: <3 ug/mL

## 2022-03-12 LAB — CBC
Hematocrit: 41.5 % (ref 34.0–46.6)
Hemoglobin: 13.8 g/dL (ref 11.1–15.9)
MCH: 29.1 pg (ref 26.6–33.0)
MCHC: 33.3 g/dL (ref 31.5–35.7)
MCV: 87 fL (ref 79–97)
Platelets: 223 10*3/uL (ref 150–450)
RBC: 4.75 x10E6/uL (ref 3.77–5.28)
RDW: 12.4 % (ref 11.7–15.4)
WBC: 3.8 10*3/uL (ref 3.4–10.8)

## 2022-03-12 LAB — INSULIN, RANDOM: INSULIN: 13.5 u[IU]/mL (ref 2.6–24.9)

## 2022-03-22 ENCOUNTER — Encounter: Payer: Self-pay | Admitting: Internal Medicine

## 2022-04-07 ENCOUNTER — Other Ambulatory Visit: Payer: Self-pay

## 2022-04-07 DIAGNOSIS — I10 Essential (primary) hypertension: Secondary | ICD-10-CM

## 2022-04-07 MED ORDER — VALSARTAN 160 MG PO TABS: ORAL_TABLET | ORAL | 1 refills | Status: DC

## 2022-05-05 ENCOUNTER — Encounter: Payer: Self-pay | Admitting: Internal Medicine

## 2022-09-13 ENCOUNTER — Encounter: Payer: Self-pay | Admitting: Internal Medicine

## 2022-09-13 ENCOUNTER — Ambulatory Visit: Payer: BC Managed Care – PPO | Admitting: Internal Medicine

## 2022-09-13 VITALS — BP 140/72 | HR 60 | Temp 98.1°F | Ht 61.0 in | Wt 193.8 lb

## 2022-09-13 DIAGNOSIS — I10 Essential (primary) hypertension: Secondary | ICD-10-CM | POA: Diagnosis not present

## 2022-09-13 DIAGNOSIS — Z2821 Immunization not carried out because of patient refusal: Secondary | ICD-10-CM

## 2022-09-13 DIAGNOSIS — Z6836 Body mass index (BMI) 36.0-36.9, adult: Secondary | ICD-10-CM

## 2022-09-13 DIAGNOSIS — E7800 Pure hypercholesterolemia, unspecified: Secondary | ICD-10-CM

## 2022-09-13 DIAGNOSIS — E66812 Obesity, class 2: Secondary | ICD-10-CM

## 2022-09-13 NOTE — Progress Notes (Signed)
Barnet Glasgow Martin,acting as a Education administrator for Maximino Greenland, MD.,have documented all relevant documentation on the behalf of Maximino Greenland, MD,as directed by  Maximino Greenland, MD while in the presence of Maximino Greenland, MD.    Subjective:     Patient ID: Rita Roberts , female    DOB: May 19, 1956 , 66 y.o.   MRN: [id number]   Chief Complaint  Patient presents with   Hypertension    HPI  Patient presents today for a BP check. She reports compliance with meds. She states she is exercising 2-3 days per week. She she denies having any headaches, chest pain and shortness of breath.     BP Readings from Last 3 Encounters: 09/13/22 : (!) 140/82 03/11/22 : 132/80 09/17/21 : 122/76    Hypertension This is a chronic problem. The current episode started more than 1 year ago. The problem has been gradually improving since onset. The problem is uncontrolled. Pertinent negatives include no blurred vision, chest pain, palpitations or shortness of breath. Risk factors for coronary artery disease include post-menopausal state. Past treatments include angiotensin blockers. The current treatment provides moderate improvement.     Past Medical History:  Diagnosis Date   Anemia    Arthritis    "probably in left shoulder" (02/09/2018)   Breast cancer, left breast ([facility]) 11/2017   Family history of adverse reaction to anesthesia    "my mom may have; not sure" (02/09/2018)   GERD (gastroesophageal reflux disease)    Heartburn    Pneumonia 1960     Family History  Problem Relation Age of Onset   Dementia Mother    Lung cancer Father    Healthy Sister      Current Outpatient Medications:    ibuprofen (ADVIL,MOTRIN) 200 MG tablet, Take 400 mg by mouth daily as needed for headache or moderate pain., Disp: , Rfl:    Nutritional Supplements (MENOPAUSE FORMULA) TABS, Take 1 tablet by mouth daily., Disp: , Rfl:    valsartan (DIOVAN) 160 MG tablet, TAKE 1 TABLET(160 MG) BY MOUTH DAILY, Disp: 90  tablet, Rfl: 1   No Known Allergies   Review of Systems  Constitutional: Negative.   HENT: Negative.    Eyes: Negative.  Negative for blurred vision.  Respiratory: Negative.  Negative for shortness of breath.   Cardiovascular: Negative.  Negative for chest pain and palpitations.  Gastrointestinal: Negative.      Today's Vitals   09/13/22 1406 09/13/22 1431  BP: (!) 140/82 (!) 140/72  Pulse: 60   Temp: 98.1 F (36.7 C)   TempSrc: Oral   Weight: 193 lb 12.8 oz (87.9 kg)   Height: _0  (1.549 m)   PainSc: 0-No pain    Body mass index is 36.62 kg/m.  Wt Readings from Last 3 Encounters:  09/13/22 193 lb 12.8 oz (87.9 kg)  03/11/22 197 lb (89.4 kg)  09/17/21 192 lb 12.8 oz (87.5 kg)   BP Readings from Last 3 Encounters:  09/13/22 (!) 140/72  03/11/22 132/80  09/17/21 122/76     Objective:  Physical Exam Vitals and nursing note reviewed.  Constitutional:      Appearance: Normal appearance. She is obese.  HENT:     Head: Normocephalic and atraumatic.     Nose:     Comments: Masked     Mouth/Throat:     Comments: Masked  Eyes:     Extraocular Movements: Extraocular movements intact.  Cardiovascular:     Rate and Rhythm:  Normal rate and regular rhythm.     Heart sounds: Normal heart sounds.  Pulmonary:     Effort: Pulmonary effort is normal.     Breath sounds: Normal breath sounds.  Musculoskeletal:     Cervical back: Normal range of motion.  Skin:    General: Skin is warm.  Neurological:     General: No focal deficit present.     Mental Status: She is alert.  Psychiatric:        Mood and Affect: Mood normal.        Behavior: Behavior normal.      Assessment And Plan:     1. Essential hypertension, benign Comments: Chronic, uncontrolled. She agrees to rto 4 wks for nurse visit. If elevated, I will add nightly amlodipine 2.5-63m nightly. Dietary compliance stressed to pt. - BMP8+eGFR - Hemoglobin A1c  2. Pure hypercholesterolemia Comments: Last LDL  155 on May 2023. ASCVD risk is 11%. We discussed use of calcium cardiac scoring, she was given information to read at her leisure.  3. Class 2 severe obesity due to excess calories with serious comorbidity and body mass index (BMI) of 36.0 to 36.9 in adult ([facility] Comments: She is encouraged to aim for at least 150 minutes of exercise per week, while striving for BMI<30 to decrease cardiac risk.  4. Immunization declined    Patient was given opportunity to ask questions. Patient verbalized understanding of the plan and was able to repeat key elements of the plan. All questions were answered to their satisfaction.   I, RMaximino Greenland MD, have reviewed all documentation for this visit. The documentation on 09/13/22 for the exam, diagnosis, procedures, and orders are all accurate and complete.   IF YOU HAVE BEEN REFERRED TO A SPECIALIST, IT MAY TAKE 1-2 WEEKS TO SCHEDULE/PROCESS THE REFERRAL. IF YOU HAVE NOT HEARD FROM US/SPECIALIST IN TWO WEEKS, PLEASE GIVE [place] CALL AT [phone] X [phone].   THE PATIENT IS ENCOURAGED TO PRACTICE SOCIAL DISTANCING DUE TO THE COVID-19 PANDEMIC.

## 2022-09-13 NOTE — Patient Instructions (Addendum)
The 10-year ASCVD risk score (Arnett DK, et al., 2019) is: 11.7%   Values used to calculate the score:     Age: 66 years     Sex: Female     Is Non-Hispanic African American: Yes     Diabetic: No     Tobacco smoker: No     Systolic Blood Pressure: 176 mmHg     Is BP treated: Yes     HDL Cholesterol: 47 mg/dL     Total Cholesterol: 210 mg/dL  Coronary Calcium Scan A coronary calcium scan is an imaging test used to look for deposits of plaque in the inner lining of the blood vessels of the heart (coronary arteries). Plaque is made up of calcium, protein, and fatty substances. These deposits of plaque can partly clog and narrow the coronary arteries without producing any symptoms or warning signs. This puts a person at risk for a heart attack. A coronary calcium scan is performed using a computed tomography (CT) scanner machine without using a dye (contrast). This test is recommended for people who are at moderate risk for heart disease. The test can find plaque deposits before symptoms develop. Tell a health care provider about: Any allergies you have. All medicines you are taking, including vitamins, herbs, eye drops, creams, and over-the-counter medicines. Any problems you or family members have had with anesthetic medicines. Any bleeding problems you have. Any surgeries you have had. Any medical conditions you have. Whether you are pregnant or may be pregnant. What are the risks? Generally, this is a safe procedure. However, problems may occur, including: Harm to a pregnant woman and her unborn baby. This test involves the use of radiation. Radiation exposure can be dangerous to a pregnant woman and her unborn baby. If you are pregnant or think you may be pregnant, you should not have this procedure done. A slight increase in the risk of cancer. This is because of the radiation involved in the test. The amount of radiation from one test is similar to the amount of radiation you are  naturally exposed to over one year. What happens before the procedure? Ask your health care provider for any specific instructions on how to prepare for this procedure. You may be asked to avoid products that contain caffeine, tobacco, or nicotine for 4 hours before the procedure. What happens during the procedure?  You will undress and remove any jewelry from your neck or chest. You may need to remove hearing aides and dentures. Women may need to remove their bras. You will put on a hospital gown. Sticky electrodes will be placed on your chest. The electrodes will be connected to an electrocardiogram (ECG) machine to record a tracing of the electrical activity of your heart. You will lie down on your back on a curved bed that is attached to the [place]. You may be given medicine to slow down your heart rate so that clear pictures can be created. You will be moved into the CT scanner, and the CT scanner will take pictures of your heart. During this time, you will be asked to lie still and hold your breath for 10-20 seconds at a time while each picture of your heart is being taken. The procedure may vary among health care providers and hospitals. What can I expect after the procedure? You can return to your normal activities. It is up to you to get the results of your procedure. Ask your health care provider, or the department that is doing  the procedure, when your results will be ready. Summary A coronary calcium scan is an imaging test used to look for deposits of plaque in the inner lining of the blood vessels of the heart. Plaque is made up of calcium, protein, and fatty substances. A coronary calcium scan is performed using a CT scanner machine without contrast. Generally, this is a safe procedure. Tell your health care provider if you are pregnant or may be pregnant. Ask your health care provider for any specific instructions on how to prepare for this procedure. You can return to your  normal activities after the scan is done. This information is not intended to replace advice given to you by your health care provider. Make sure you discuss any questions you have with your health care provider. Document Revised: 09/27/2021 Document Reviewed: 09/27/2021 Elsevier Patient Education  [place].  Hypertension, Adult Hypertension is another name for high blood pressure. High blood pressure forces your heart to work harder to pump blood. This can cause problems over time. There are two numbers in a blood pressure reading. There is a top number (systolic) over a bottom number (diastolic). It is best to have a blood pressure that is below 120/80. What are the causes? The cause of this condition is not known. Some other conditions can lead to high blood pressure. What increases the risk? Some lifestyle factors can make you more likely to develop high blood pressure: Smoking. Not getting enough exercise or physical activity. Being overweight. Having too much fat, sugar, calories, or salt (sodium) in your diet. Drinking too much alcohol. Other risk factors include: Having any of these conditions: Heart disease. Diabetes. High cholesterol. Kidney disease. Obstructive sleep apnea. Having a family history of high blood pressure and high cholesterol. Age. The risk increases with age. Stress. What are the signs or symptoms? High blood pressure may not cause symptoms. Very high blood pressure (hypertensive crisis) may cause: Headache. Fast or uneven heartbeats (palpitations). Shortness of breath. Nosebleed. Vomiting or feeling like you may vomit (nauseous). Changes in how you see. Very bad chest pain. Feeling dizzy. Seizures. How is this treated? This condition is treated by making healthy lifestyle changes, such as: Eating healthy foods. Exercising more. Drinking less alcohol. Your doctor may prescribe medicine if lifestyle changes do not help enough and  if: Your top number is above 130. Your bottom number is above 80. Your personal target blood pressure may vary. Follow these instructions at home: Eating and drinking  If told, follow the DASH eating plan. To follow this plan: Fill one half of your plate at each meal with fruits and vegetables. Fill one fourth of your plate at each meal with whole grains. Whole grains include whole-wheat pasta, brown rice, and whole-grain bread. Eat or drink low-fat dairy products, such as skim milk or low-fat yogurt. Fill one fourth of your plate at each meal with low-fat (lean) proteins. Low-fat proteins include fish, chicken without skin, eggs, beans, and tofu. Avoid fatty meat, cured and processed meat, or chicken with skin. Avoid pre-made or processed food. Limit the amount of salt in your diet to less than 1,500 mg each day. Do not drink alcohol if: Your doctor tells you not to drink. You are pregnant, may be pregnant, or are planning to become pregnant. If you drink alcohol: Limit how much you have to: 0-1 drink a day for women. 0-2 drinks a day for men. Know how much alcohol is in your drink. In the [place] one drink  equals one 12 oz bottle of beer (355 mL), one 5 oz glass of wine (148 mL), or one 1 oz glass of hard liquor (44 mL). Lifestyle  Work with your doctor to stay at a healthy weight or to lose weight. Ask your doctor what the best weight is for you. Get at least 30 minutes of exercise that causes your heart to beat faster (aerobic exercise) most days of the week. This may include walking, swimming, or biking. Get at least 30 minutes of exercise that strengthens your muscles (resistance exercise) at least 3 days a week. This may include lifting weights or doing Pilates. Do not smoke or use any products that contain nicotine or tobacco. If you need help quitting, ask your doctor. Check your blood pressure at home as told by your doctor. Keep all follow-up visits. Medicines Take  over-the-counter and prescription medicines only as told by your doctor. Follow directions carefully. Do not skip doses of blood pressure medicine. The medicine does not work as well if you skip doses. Skipping doses also puts you at risk for problems. Ask your doctor about side effects or reactions to medicines that you should watch for. Contact a doctor if: You think you are having a reaction to the medicine you are taking. You have headaches that keep coming back. You feel dizzy. You have swelling in your ankles. You have trouble with your vision. Get help right away if: You get a very bad headache. You start to feel mixed up (confused). You feel weak or numb. You feel faint. You have very bad pain in your: Chest. Belly (abdomen). You vomit more than once. You have trouble breathing. These symptoms may be an emergency. Get help right away. Call 911. Do not wait to see if the symptoms will go away. Do not drive yourself to the hospital. Summary Hypertension is another name for high blood pressure. High blood pressure forces your heart to work harder to pump blood. For most people, a normal blood pressure is less than 120/80. Making healthy choices can help lower blood pressure. If your blood pressure does not get lower with healthy choices, you may need to take medicine. This information is not intended to replace advice given to you by your health care provider. Make sure you discuss any questions you have with your health care provider. Document Revised: 08/06/2021 Document Reviewed: 08/06/2021 Elsevier Patient Education  [place].

## 2022-09-14 LAB — BMP8+EGFR
BUN/Creatinine Ratio: 17 (ref 12–28)
BUN: 15 mg/dL (ref 8–27)
CO2: 24 mmol/L (ref 20–29)
Calcium: 9.5 mg/dL (ref 8.7–10.3)
Chloride: 103 mmol/L (ref 96–106)
Creatinine, Ser: 0.87 mg/dL (ref 0.57–1.00)
Glucose: 80 mg/dL (ref 70–99)
Potassium: 4.3 mmol/L (ref 3.5–5.2)
Sodium: 141 mmol/L (ref 134–144)
eGFR: 74 mL/min/{1.73_m2} (ref >59)

## 2022-09-14 LAB — HEMOGLOBIN A1C
Est. average glucose Bld gHb Est-mCnc: 114 mg/dL
Hgb A1c MFr Bld: 5.6 % (ref 4.8–5.6)

## 2022-10-05 ENCOUNTER — Ambulatory Visit: Payer: BC Managed Care – PPO

## 2022-10-05 VITALS — BP 132/80 | HR 80 | Temp 98.6°F

## 2022-10-05 DIAGNOSIS — I10 Essential (primary) hypertension: Secondary | ICD-10-CM

## 2022-10-05 MED ORDER — AMLODIPINE BESYLATE 2.5 MG PO TABS: 2.5000 mg | ORAL_TABLET | Freq: Every evening | ORAL | 1 refills | Status: DC

## 2022-10-05 NOTE — Progress Notes (Signed)
Patient presents today for BPC. She is currently taking Valsartan '160mg'$ .   BP Readings from Last 3 Encounters:  10/05/22 (!) 146/82  09/13/22 (!) 140/72  03/11/22 132/80   Second reading 132/80. Provider would like to start Amlodipine 2.'5mg'$ . pt will follow up with provider in 6 weeks.

## 2022-11-16 ENCOUNTER — Ambulatory Visit: Payer: BC Managed Care – PPO | Admitting: Internal Medicine

## 2022-11-16 ENCOUNTER — Encounter: Payer: Self-pay | Admitting: Internal Medicine

## 2022-11-16 VITALS — BP 152/84 | HR 84 | Temp 98.8°F | Ht 61.4 in | Wt 194.8 lb

## 2022-11-16 DIAGNOSIS — Z6836 Body mass index (BMI) 36.0-36.9, adult: Secondary | ICD-10-CM | POA: Diagnosis not present

## 2022-11-16 DIAGNOSIS — I10 Essential (primary) hypertension: Secondary | ICD-10-CM | POA: Diagnosis not present

## 2022-11-16 MED ORDER — AMLODIPINE BESYLATE 5 MG PO TABS: 5.0000 mg | ORAL_TABLET | Freq: Every day | ORAL | 11 refills | Status: DC

## 2022-11-16 NOTE — Progress Notes (Signed)
Rich Brave [place],acting as a Education administrator for Maximino Greenland, MD.,have documented all relevant documentation on the behalf of Maximino Greenland, MD,as directed by  Maximino Greenland, MD while in the presence of Maximino Greenland, MD.    Subjective:     Patient ID: Rita Roberts , female    DOB: 03-05-1956 , 67 y.o.   MRN: [id number]   Chief Complaint  Patient presents with   Hypertension    HPI  Patient presents today for a BP check. She reports compliance with meds. Patient was started amlodipine at her last visit. She has not had any issues with the medication. Admits she sometimes forgets meds (takes at bedtime). Denies cp, sob and palpitations. Admits she is under a lot of stress at her job.    Hypertension This is a chronic problem. The current episode started more than 1 year ago. The problem has been gradually improving since onset. The problem is uncontrolled. Pertinent negatives include no blurred vision. Risk factors for coronary artery disease include post-menopausal state. Past treatments include angiotensin blockers. The current treatment provides moderate improvement.     Past Medical History:  Diagnosis Date   Anemia    Arthritis    "probably in left shoulder" (02/09/2018)   Breast cancer, left breast ([place]) 11/2017   Family history of adverse reaction to anesthesia    "my mom may have; not sure" (02/09/2018)   GERD (gastroesophageal reflux disease)    Heartburn    Pneumonia 1960     Family History  Problem Relation Age of Onset   Dementia Mother    Lung cancer Father    Healthy Sister      Current Outpatient Medications:    ibuprofen (ADVIL,MOTRIN) 200 MG tablet, Take 400 mg by mouth daily as needed for headache or moderate pain., Disp: , Rfl:    Nutritional Supplements (MENOPAUSE FORMULA) TABS, Take 1 tablet by mouth daily., Disp: , Rfl:    valsartan (DIOVAN) 160 MG tablet, TAKE 1 TABLET(160 MG) BY MOUTH DAILY, Disp: 90 tablet, Rfl: 1   No Known Allergies    Review of Systems  Constitutional: Negative.   Eyes: Negative.  Negative for blurred vision.  Respiratory: Negative.    Cardiovascular: Negative.   Musculoskeletal: Negative.   Skin: Negative.   Neurological: Negative.   Psychiatric/Behavioral: Negative.       Today's Vitals   11/16/22 1526 11/16/22 1609  BP: (!) 150/80 (!) 152/84  Pulse: 84   Temp: 98.8 F (37.1 C)   Weight: 194 lb 12.8 oz (88.4 kg)   Height: 5' 1.4" (1.56 m)   PainSc: 0-No pain    Body mass index is 36.33 kg/m.  Wt Readings from Last 3 Encounters:  11/16/22 194 lb 12.8 oz (88.4 kg)  09/13/22 193 lb 12.8 oz (87.9 kg)  03/11/22 197 lb (89.4 kg)    BP Readings from Last 3 Encounters:  11/16/22 (!) 152/84  10/05/22 132/80  09/13/22 (!) 140/72     Objective:  Physical Exam Vitals and nursing note reviewed.  Constitutional:      Appearance: Normal appearance.  HENT:     Head: Normocephalic and atraumatic.     Nose:     Comments: Masked     Mouth/Throat:     Comments: Masked  Eyes:     Extraocular Movements: Extraocular movements intact.  Cardiovascular:     Rate and Rhythm: Normal rate and regular rhythm.     Heart sounds: Normal heart sounds.  Pulmonary:     Effort: Pulmonary effort is normal.     Breath sounds: Normal breath sounds.  Musculoskeletal:     Cervical back: Normal range of motion.  Skin:    General: Skin is warm.  Neurological:     General: No focal deficit present.     Mental Status: She is alert.  Psychiatric:        Mood and Affect: Mood normal.        Behavior: Behavior normal.         Assessment And Plan:     1. Essential hypertension, benign Comments: Uncontrolled. I will increase amlodipine to '5mg'$  nightly. She is encouraged to incorporate more self care into her weekly routine. Agrees to f/u in 2 wks. She is advised to take medication with her evening meal. She will c/w valsartan dosing in the mornings.   2. Class 2 severe obesity due to excess calories  with serious comorbidity and body mass index (BMI) of 36.0 to 36.9 in adult [facility]) Comments: She is encouraged to aim for at least 150 minutes of exercise/week, while striving for BMI<30 to decrease cardiac risk.     Patient was given opportunity to ask questions. Patient verbalized understanding of the plan and was able to repeat key elements of the plan. All questions were answered to their satisfaction.   I, Maximino Greenland, MD, have reviewed all documentation for this visit. The documentation on 11/16/22 for the exam, diagnosis, procedures, and orders are all accurate and complete.   IF YOU HAVE BEEN REFERRED TO A SPECIALIST, IT MAY TAKE 1-2 WEEKS TO SCHEDULE/PROCESS THE REFERRAL. IF YOU HAVE NOT HEARD FROM US/SPECIALIST IN TWO WEEKS, PLEASE GIVE [place] A CALL AT [phone].   THE PATIENT IS ENCOURAGED TO PRACTICE SOCIAL DISTANCING DUE TO THE COVID-19 PANDEMIC.

## 2022-11-16 NOTE — Patient Instructions (Signed)
Hypertension, Adult Hypertension is another name for high blood pressure. High blood pressure forces your heart to work harder to pump blood. This can cause problems over time. There are two numbers in a blood pressure reading. There is a top number (systolic) over a bottom number (diastolic). It is best to have a blood pressure that is below 120/80. What are the causes? The cause of this condition is not known. Some other conditions can lead to high blood pressure. What increases the risk? Some lifestyle factors can make you more likely to develop high blood pressure: Smoking. Not getting enough exercise or physical activity. Being overweight. Having too much fat, sugar, calories, or salt (sodium) in your diet. Drinking too much alcohol. Other risk factors include: Having any of these conditions: Heart disease. Diabetes. High cholesterol. Kidney disease. Obstructive sleep apnea. Having a family history of high blood pressure and high cholesterol. Age. The risk increases with age. Stress. What are the signs or symptoms? High blood pressure may not cause symptoms. Very high blood pressure (hypertensive crisis) may cause: Headache. Fast or uneven heartbeats (palpitations). Shortness of breath. Nosebleed. Vomiting or feeling like you may vomit (nauseous). Changes in how you see. Very bad chest pain. Feeling dizzy. Seizures. How is this treated? This condition is treated by making healthy lifestyle changes, such as: Eating healthy foods. Exercising more. Drinking less alcohol. Your doctor may prescribe medicine if lifestyle changes do not help enough and if: Your top number is above 130. Your bottom number is above 80. Your personal target blood pressure may vary. Follow these instructions at home: Eating and drinking  If told, follow the DASH eating plan. To follow this plan: Fill one half of your plate at each meal with fruits and vegetables. Fill one fourth of your plate  at each meal with whole grains. Whole grains include whole-wheat pasta, brown rice, and whole-grain bread. Eat or drink low-fat dairy products, such as skim milk or low-fat yogurt. Fill one fourth of your plate at each meal with low-fat (lean) proteins. Low-fat proteins include fish, chicken without skin, eggs, beans, and tofu. Avoid fatty meat, cured and processed meat, or chicken with skin. Avoid pre-made or processed food. Limit the amount of salt in your diet to less than 1,500 mg each day. Do not drink alcohol if: Your doctor tells you not to drink. You are pregnant, may be pregnant, or are planning to become pregnant. If you drink alcohol: Limit how much you have to: 0-1 drink a day for women. 0-2 drinks a day for men. Know how much alcohol is in your drink. In the [place] one drink equals one 12 oz bottle of beer (355 mL), one 5 oz glass of wine (148 mL), or one 1 oz glass of hard liquor (44 mL). Lifestyle  Work with your doctor to stay at a healthy weight or to lose weight. Ask your doctor what the best weight is for you. Get at least 30 minutes of exercise that causes your heart to beat faster (aerobic exercise) most days of the week. This may include walking, swimming, or biking. Get at least 30 minutes of exercise that strengthens your muscles (resistance exercise) at least 3 days a week. This may include lifting weights or doing Pilates. Do not smoke or use any products that contain nicotine or tobacco. If you need help quitting, ask your doctor. Check your blood pressure at home as told by your doctor. Keep all follow-up visits. Medicines Take over-the-counter and prescription medicines  only as told by your doctor. Follow directions carefully. ?Do not skip doses of blood pressure medicine. The medicine does not work as well if you skip doses. Skipping doses also puts you at risk for problems. ?Ask your doctor about side effects or reactions to medicines that you should watch  for. ?Contact a doctor if: ?You think you are having a reaction to the medicine you are taking. ?You have headaches that keep coming back. ?You feel dizzy. ?You have swelling in your ankles. ?You have trouble with your vision. ?Get help right away if: ?You get a very bad headache. ?You start to feel mixed up (confused). ?You feel weak or numb. ?You feel faint. ?You have very bad pain in your: ?Chest. ?Belly (abdomen). ?You vomit more than once. ?You have trouble breathing. ?These symptoms may be an emergency. Get help right away. Call 911. ?Do not wait to see if the symptoms will go away. ?Do not drive yourself to the hospital. ?Summary ?Hypertension is another name for high blood pressure. ?High blood pressure forces your heart to work harder to pump blood. ?For most people, a normal blood pressure is less than 120/80. ?Making healthy choices can help lower blood pressure. If your blood pressure does not get lower with healthy choices, you may need to take medicine. ?This information is not intended to replace advice given to you by your health care provider. Make sure you discuss any questions you have with your health care provider. ?Document Revised: 08/06/2021 Document Reviewed: 08/06/2021 ?Elsevier Patient Education ? 2023 Elsevier Inc. ? ?

## 2022-11-30 ENCOUNTER — Ambulatory Visit: Payer: BC Managed Care – PPO

## 2022-11-30 VITALS — BP 122/88 | HR 75 | Temp 98.1°F | Ht 61.0 in | Wt 194.0 lb

## 2022-11-30 DIAGNOSIS — I10 Essential (primary) hypertension: Secondary | ICD-10-CM

## 2022-11-30 NOTE — Patient Instructions (Signed)
Hypertension, Adult Hypertension is another name for high blood pressure. High blood pressure forces your heart to work harder to pump blood. This can cause problems over time. There are two numbers in a blood pressure reading. There is a top number (systolic) over a bottom number (diastolic). It is best to have a blood pressure that is below 120/80. What are the causes? The cause of this condition is not known. Some other conditions can lead to high blood pressure. What increases the risk? Some lifestyle factors can make you more likely to develop high blood pressure: Smoking. Not getting enough exercise or physical activity. Being overweight. Having too much fat, sugar, calories, or salt (sodium) in your diet. Drinking too much alcohol. Other risk factors include: Having any of these conditions: Heart disease. Diabetes. High cholesterol. Kidney disease. Obstructive sleep apnea. Having a family history of high blood pressure and high cholesterol. Age. The risk increases with age. Stress. What are the signs or symptoms? High blood pressure may not cause symptoms. Very high blood pressure (hypertensive crisis) may cause: Headache. Fast or uneven heartbeats (palpitations). Shortness of breath. Nosebleed. Vomiting or feeling like you may vomit (nauseous). Changes in how you see. Very bad chest pain. Feeling dizzy. Seizures. How is this treated? This condition is treated by making healthy lifestyle changes, such as: Eating healthy foods. Exercising more. Drinking less alcohol. Your doctor may prescribe medicine if lifestyle changes do not help enough and if: Your top number is above 130. Your bottom number is above 80. Your personal target blood pressure may vary. Follow these instructions at home: Eating and drinking  If told, follow the DASH eating plan. To follow this plan: Fill one half of your plate at each meal with fruits and vegetables. Fill one fourth of your plate  at each meal with whole grains. Whole grains include whole-wheat pasta, brown rice, and whole-grain bread. Eat or drink low-fat dairy products, such as skim milk or low-fat yogurt. Fill one fourth of your plate at each meal with low-fat (lean) proteins. Low-fat proteins include fish, chicken without skin, eggs, beans, and tofu. Avoid fatty meat, cured and processed meat, or chicken with skin. Avoid pre-made or processed food. Limit the amount of salt in your diet to less than 1,500 mg each day. Do not drink alcohol if: Your doctor tells you not to drink. You are pregnant, may be pregnant, or are planning to become pregnant. If you drink alcohol: Limit how much you have to: 0-1 drink a day for women. 0-2 drinks a day for men. Know how much alcohol is in your drink. In the [place] one drink equals one 12 oz bottle of beer (355 mL), one 5 oz glass of wine (148 mL), or one 1 oz glass of hard liquor (44 mL). Lifestyle  Work with your doctor to stay at a healthy weight or to lose weight. Ask your doctor what the best weight is for you. Get at least 30 minutes of exercise that causes your heart to beat faster (aerobic exercise) most days of the week. This may include walking, swimming, or biking. Get at least 30 minutes of exercise that strengthens your muscles (resistance exercise) at least 3 days a week. This may include lifting weights or doing Pilates. Do not smoke or use any products that contain nicotine or tobacco. If you need help quitting, ask your doctor. Check your blood pressure at home as told by your doctor. Keep all follow-up visits. Medicines Take over-the-counter and prescription medicines  only as told by your doctor. Follow directions carefully. ?Do not skip doses of blood pressure medicine. The medicine does not work as well if you skip doses. Skipping doses also puts you at risk for problems. ?Ask your doctor about side effects or reactions to medicines that you should watch  for. ?Contact a doctor if: ?You think you are having a reaction to the medicine you are taking. ?You have headaches that keep coming back. ?You feel dizzy. ?You have swelling in your ankles. ?You have trouble with your vision. ?Get help right away if: ?You get a very bad headache. ?You start to feel mixed up (confused). ?You feel weak or numb. ?You feel faint. ?You have very bad pain in your: ?Chest. ?Belly (abdomen). ?You vomit more than once. ?You have trouble breathing. ?These symptoms may be an emergency. Get help right away. Call 911. ?Do not wait to see if the symptoms will go away. ?Do not drive yourself to the hospital. ?Summary ?Hypertension is another name for high blood pressure. ?High blood pressure forces your heart to work harder to pump blood. ?For most people, a normal blood pressure is less than 120/80. ?Making healthy choices can help lower blood pressure. If your blood pressure does not get lower with healthy choices, you may need to take medicine. ?This information is not intended to replace advice given to you by your health care provider. Make sure you discuss any questions you have with your health care provider. ?Document Revised: 08/06/2021 Document Reviewed: 08/06/2021 ?Elsevier Patient Education ? 2023 Elsevier Inc. ? ?

## 2022-11-30 NOTE — Progress Notes (Signed)
Pt presents today for bpc. She currently takes Amlodipine '5MG'$  at night & Valsartan '160MG'$  in the morning. She reports taking medicine before coming this morning. She states drinking 4 bottles of water a day. Patient also exercises 2-3 times a week. She has not had breakfast this morning.  BP Readings from Last 3 Encounters:  11/30/22 122/88  11/16/22 (!) 152/84  10/05/22 132/80   Initial bp before waiting 10 minutes: 134/90 Patient will follow up at upcoming appointment for her physical on 03/24/2023.

## 2022-12-10 LAB — HM MAMMOGRAPHY

## 2023-02-11 LAB — HM COLONOSCOPY

## 2023-03-14 ENCOUNTER — Encounter: Payer: Self-pay | Admitting: Internal Medicine

## 2023-03-24 ENCOUNTER — Encounter: Payer: Self-pay | Admitting: Internal Medicine

## 2023-03-24 ENCOUNTER — Ambulatory Visit (INDEPENDENT_AMBULATORY_CARE_PROVIDER_SITE_OTHER): Payer: BC Managed Care – PPO | Admitting: Internal Medicine

## 2023-03-24 VITALS — BP 128/84 | HR 71 | Temp 98.5°F | Ht 61.0 in | Wt 191.5 lb

## 2023-03-24 DIAGNOSIS — E7800 Pure hypercholesterolemia, unspecified: Secondary | ICD-10-CM | POA: Insufficient documentation

## 2023-03-24 DIAGNOSIS — Z853 Personal history of malignant neoplasm of breast: Secondary | ICD-10-CM

## 2023-03-24 DIAGNOSIS — Z0000 Encounter for general adult medical examination without abnormal findings: Secondary | ICD-10-CM | POA: Diagnosis not present

## 2023-03-24 DIAGNOSIS — I10 Essential (primary) hypertension: Secondary | ICD-10-CM

## 2023-03-24 DIAGNOSIS — C50912 Malignant neoplasm of unspecified site of left female breast: Secondary | ICD-10-CM

## 2023-03-24 LAB — POCT URINALYSIS DIPSTICK
Bilirubin, UA: NEGATIVE
Glucose, UA: NEGATIVE
Ketones, UA: NEGATIVE
Leukocytes, UA: NEGATIVE
Nitrite, UA: NEGATIVE
Protein, UA: NEGATIVE
Spec Grav, UA: 1.02 (ref 1.010–1.025)
Urobilinogen, UA: 0.2 E.U./dL
pH, UA: 7 (ref 5.0–8.0)

## 2023-03-24 MED ORDER — AMLODIPINE BESYLATE 5 MG PO TABS: 5.0000 mg | ORAL_TABLET | Freq: Every day | ORAL | 2 refills | Status: DC

## 2023-03-24 MED ORDER — VALSARTAN 160 MG PO TABS: ORAL_TABLET | ORAL | 2 refills | Status: DC

## 2023-03-24 NOTE — Patient Instructions (Signed)

## 2023-03-24 NOTE — Progress Notes (Signed)
I,Rita Roberts,acting as a scribe for Rita Aliment, MD.,have documented all relevant documentation on the behalf of Rita Aliment, MD,as directed by  Rita Aliment, MD while in the presence of Rita Aliment, MD.   Subjective:     Patient ID: Rita Roberts , female    DOB: 10/29/1956 , 67 y.o.   MRN: [id number]   Chief Complaint  Patient presents with   Annual Exam   Hypertension   Hyperlipidemia    HPI  The patient is here for a physical examination.  That patient has her GYN exams performed by Dr. Jaymes Graff. She reports compliance with meds. She denies headaches, chest pain and shortness of breath.   Letter sent to gyn for pap/mammo result.   Hypertension This is a chronic problem. The current episode started more than 1 year ago. The problem has been gradually improving since onset. The problem is uncontrolled. Pertinent negatives include no blurred vision, chest pain, palpitations or shortness of breath. Risk factors for coronary artery disease include post-menopausal state. Past treatments include angiotensin blockers. The current treatment provides moderate improvement.     Past Medical History:  Diagnosis Date   Anemia    Arthritis    "probably in left shoulder" (02/09/2018)   Breast cancer, left breast (HCC) 11/2017   Family history of adverse reaction to anesthesia    "my mom may have; not sure" (02/09/2018)   GERD (gastroesophageal reflux disease)    Heartburn    Pneumonia 1960     Family History  Problem Relation Age of Onset   Dementia Mother    Lung cancer Father    Healthy Sister      Current Outpatient Medications:    amLODipine (NORVASC) 5 MG tablet, Take 1 tablet (5 mg total) by mouth daily., Disp: 90 tablet, Rfl: 2   ibuprofen (ADVIL,MOTRIN) 200 MG tablet, Take 400 mg by mouth daily as needed for headache or moderate pain., Disp: , Rfl:    Nutritional Supplements (MENOPAUSE FORMULA) TABS, Take 1 tablet by mouth daily., Disp: , Rfl:     valsartan (DIOVAN) 160 MG tablet, TAKE 1 TABLET(160 MG) BY MOUTH DAILY, Disp: 90 tablet, Rfl: 2   No Known Allergies    The patient states she uses post menopausal status for birth control. Last LMP was No LMP recorded. Patient is postmenopausal.. Negative for Dysmenorrhea. Negative for: breast discharge, breast lump(s), breast pain and breast self exam. Associated symptoms include abnormal vaginal bleeding. Pertinent negatives include abnormal bleeding (hematology), anxiety, decreased libido, depression, difficulty falling sleep, dyspareunia, history of infertility, nocturia, sexual dysfunction, sleep disturbances, urinary incontinence, urinary urgency, vaginal discharge and vaginal itching. Diet regular.The patient states her exercise level is intermittent.   . The patient's tobacco use is:  Social History   Tobacco Use  Smoking Status Former   Packs/day: 0.50   Years: 15.00   Additional pack years: 0.00   Total pack years: 7.50   Types: Cigarettes   Quit date: 1990   Years since quitting: 34.4  Smokeless Tobacco Never  . She has been exposed to passive smoke. The patient's alcohol use is:  Social History   Substance and Sexual Activity  Alcohol Use Not Currently    Review of Systems  Constitutional: Negative.   HENT: Negative.    Eyes: Negative.  Negative for blurred vision.  Respiratory: Negative.  Negative for shortness of breath.   Cardiovascular: Negative.  Negative for chest pain and palpitations.  Gastrointestinal: Negative.  Endocrine: Negative.   Genitourinary: Negative.   Musculoskeletal: Negative.   Skin: Negative.   Allergic/Immunologic: Negative.   Neurological: Negative.   Hematological: Negative.   Psychiatric/Behavioral: Negative.       Today's Vitals   03/24/23 0840  BP: 128/84  Pulse: 71  Temp: 98.5 F (36.9 C)  SpO2: 98%  Weight: 191 lb 8 oz (86.9 kg)  Height: 5\' 1"  (1.549 m)  PainSc: 0-No pain   Body mass index is 36.18 kg/m.  Wt  Readings from Last 3 Encounters:  03/24/23 191 lb 8 oz (86.9 kg)  11/30/22 194 lb (88 kg)  11/16/22 194 lb 12.8 oz (88.4 kg)   BP Readings from Last 3 Encounters:  03/24/23 128/84  11/30/22 122/88  11/16/22 (!) 152/84   The 10-year ASCVD risk score (Arnett DK, et al., 2019) is: 9.7%   Values used to calculate the score:     Age: 30 years     Sex: Female     Is Non-Hispanic African American: Yes     Diabetic: No     Tobacco smoker: No     Systolic Blood Pressure: 128 mmHg     Is BP treated: Yes     HDL Cholesterol: 50 mg/dL     Total Cholesterol: 220 mg/dL  Objective:  Physical Exam Vitals and nursing note reviewed.  Constitutional:      Appearance: Normal appearance.  HENT:     Head: Normocephalic and atraumatic.     Right Ear: Tympanic membrane, ear canal and external ear normal.     Left Ear: Tympanic membrane, ear canal and external ear normal.     Nose: Nose normal.     Mouth/Throat:     Mouth: Mucous membranes are moist.     Pharynx: Oropharynx is clear.  Eyes:     Extraocular Movements: Extraocular movements intact.     Conjunctiva/sclera: Conjunctivae normal.     Pupils: Pupils are equal, round, and reactive to light.  Cardiovascular:     Rate and Rhythm: Normal rate and regular rhythm.     Pulses: Normal pulses.     Heart sounds: Normal heart sounds.  Pulmonary:     Effort: Pulmonary effort is normal.     Breath sounds: Normal breath sounds.  Chest:  Breasts:    Tanner Score is 5.     Left: Absent.  Abdominal:     General: Abdomen is flat. Bowel sounds are normal.     Palpations: Abdomen is soft.  Genitourinary:    Comments: deferred Musculoskeletal:        General: Normal range of motion.     Cervical back: Normal range of motion and neck supple.  Skin:    General: Skin is warm and dry.  Neurological:     General: No focal deficit present.     Mental Status: She is alert and oriented to person, place, and time.  Psychiatric:        Mood and  Affect: Mood normal.        Behavior: Behavior normal.         Assessment And Plan:     1. Encounter for general adult medical examination w/o abnormal findings Comments: A full exam was performed. Importance of monthly self breast exams was discussed with the patient. She will continue having pelvic exams w/ Dr. Normand Sloop.  PATIENT IS ADVISED TO GET 30-45 MINUTES REGULAR EXERCISE NO LESS THAN FOUR TO FIVE DAYS PER WEEK - BOTH WEIGHTBEARING EXERCISES AND AEROBIC  ARE RECOMMENDED.  PATIENT IS ADVISED TO FOLLOW A HEALTHY DIET WITH AT LEAST SIX FRUITS/VEGGIES PER DAY, DECREASE INTAKE OF RED MEAT, AND TO INCREASE FISH INTAKE TO TWO DAYS PER WEEK.  MEATS/FISH SHOULD NOT BE FRIED, BAKED OR BROILED IS PREFERABLE.  IT IS ALSO IMPORTANT TO CUT BACK ON YOUR SUGAR INTAKE. PLEASE AVOID ANYTHING WITH ADDED SUGAR, CORN SYRUP OR OTHER SWEETENERS. IF YOU MUST USE A SWEETENER, YOU CAN TRY STEVIA. IT IS ALSO IMPORTANT TO AVOID ARTIFICIALLY SWEETENERS AND DIET BEVERAGES. LASTLY, I SUGGEST WEARING SPF 50 SUNSCREEN ON EXPOSED PARTS AND ESPECIALLY WHEN IN THE DIRECT SUNLIGHT FOR AN EXTENDED PERIOD OF TIME.  PLEASE AVOID FAST FOOD RESTAURANTS AND INCREASE YOUR WATER INTAKE. - CBC - CMP14+EGFR - Lipid panel - TSH  2. Essential hypertension, benign Comments: Chronic, controlled. 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 She will c/w amlodipine 10mg  and valsartan 160mg  daily. F/u 6 mos. - POCT Urinalysis Dipstick (81002) - Microalbumin / Creatinine Urine Ratio - EKG 12-Lead - valsartan (DIOVAN) 160 MG tablet; TAKE 1 TABLET(160 MG) BY MOUTH DAILY  Dispense: 90 tablet; Refill: 2 - amLODipine (NORVASC) 5 MG tablet; Take 1 tablet (5 mg total) by mouth daily.  Dispense: 90 tablet; Refill: 2  3. Pure hypercholesterolemia Comments: ASCVD risk is elevated. Will consider cardiac calcium scoring in the future.  She does not wish to take rx meds at this time.  4. History of left breast  cancer Comments: Initially diagnosed Jan 2019. She is s/p mastectomy. XRT was not indicated. She has since stopped anastrazole.  Last Oncology evaluation was in  2019.    Return for 1 year physical, 6 month bp. Patient was given opportunity to ask questions. Patient verbalized understanding of the plan and was able to repeat key elements of the plan. All questions were answered to their satisfaction.   I, Rita Aliment, MD, have reviewed all documentation for this visit. The documentation on 03/24/23 for the exam, diagnosis, procedures, and orders are all accurate and complete.  THE PATIENT IS ENCOURAGED TO PRACTICE SOCIAL DISTANCING DUE TO THE COVID-19 PANDEMIC.

## 2023-03-25 LAB — CMP14+EGFR
ALT: 20 IU/L (ref 0–32)
AST: 21 IU/L (ref 0–40)
Albumin/Globulin Ratio: 1.5 (ref 1.2–2.2)
Albumin: 4.3 g/dL (ref 3.9–4.9)
Alkaline Phosphatase: 66 IU/L (ref 44–121)
BUN/Creatinine Ratio: 16 (ref 12–28)
BUN: 13 mg/dL (ref 8–27)
Bilirubin Total: 0.7 mg/dL (ref 0.0–1.2)
CO2: 22 mmol/L (ref 20–29)
Calcium: 9.9 mg/dL (ref 8.7–10.3)
Chloride: 103 mmol/L (ref 96–106)
Creatinine, Ser: 0.82 mg/dL (ref 0.57–1.00)
Globulin, Total: 2.8 g/dL (ref 1.5–4.5)
Glucose: 89 mg/dL (ref 70–99)
Potassium: 4.3 mmol/L (ref 3.5–5.2)
Sodium: 141 mmol/L (ref 134–144)
Total Protein: 7.1 g/dL (ref 6.0–8.5)
eGFR: 80 mL/min/{1.73_m2} (ref >59)

## 2023-03-25 LAB — CBC
Hematocrit: 41.3 % (ref 34.0–46.6)
Hemoglobin: 13.7 g/dL (ref 11.1–15.9)
MCH: 29.3 pg (ref 26.6–33.0)
MCHC: 33.2 g/dL (ref 31.5–35.7)
MCV: 88 fL (ref 79–97)
Platelets: 232 10*3/uL (ref 150–450)
RBC: 4.68 x10E6/uL (ref 3.77–5.28)
RDW: 12.7 % (ref 11.7–15.4)
WBC: 4.4 10*3/uL (ref 3.4–10.8)

## 2023-03-25 LAB — MICROALBUMIN / CREATININE URINE RATIO
Creatinine, Urine: 32.5 mg/dL
Microalb/Creat Ratio: <9 mg/g creat (ref 0–29)
Microalbumin, Urine: <3 ug/mL

## 2023-03-25 LAB — LIPID PANEL
Chol/HDL Ratio: 4.4 ratio (ref 0.0–4.4)
Cholesterol, Total: 220 mg/dL — ABNORMAL HIGH (ref 100–199)
HDL: 50 mg/dL (ref >39)
LDL Chol Calc (NIH): 161 mg/dL — ABNORMAL HIGH (ref 0–99)
Triglycerides: 53 mg/dL (ref 0–149)
VLDL Cholesterol Cal: 9 mg/dL (ref 5–40)

## 2023-03-25 LAB — TSH: TSH: 0.857 u[IU]/mL (ref 0.450–4.500)

## 2023-03-27 ENCOUNTER — Encounter: Payer: Self-pay | Admitting: Internal Medicine

## 2023-03-29 ENCOUNTER — Encounter: Payer: Self-pay | Admitting: Internal Medicine

## 2023-03-29 ENCOUNTER — Other Ambulatory Visit: Payer: Self-pay | Admitting: Internal Medicine

## 2023-03-29 DIAGNOSIS — E7800 Pure hypercholesterolemia, unspecified: Secondary | ICD-10-CM

## 2023-04-11 ENCOUNTER — Encounter: Payer: Self-pay | Admitting: Internal Medicine

## 2023-04-11 ENCOUNTER — Other Ambulatory Visit: Payer: Self-pay

## 2023-04-11 DIAGNOSIS — I10 Essential (primary) hypertension: Secondary | ICD-10-CM

## 2023-04-11 MED ORDER — VALSARTAN 160 MG PO TABS: ORAL_TABLET | ORAL | 2 refills | Status: DC

## 2023-04-14 ENCOUNTER — Ambulatory Visit (HOSPITAL_COMMUNITY)
Admission: RE | Admit: 2023-04-14 | Discharge: 2023-04-14 | Disposition: A | Discharge status: Home / Self Care | Payer: BC Managed Care – PPO | Source: Ambulatory Visit | Attending: Internal Medicine | Admitting: Internal Medicine

## 2023-04-14 DIAGNOSIS — E7800 Pure hypercholesterolemia, unspecified: Secondary | ICD-10-CM | POA: Insufficient documentation

## 2023-04-15 ENCOUNTER — Other Ambulatory Visit: Payer: Self-pay | Admitting: Internal Medicine

## 2023-04-15 DIAGNOSIS — E7800 Pure hypercholesterolemia, unspecified: Secondary | ICD-10-CM

## 2023-04-15 DIAGNOSIS — I2510 Atherosclerotic heart disease of native coronary artery without angina pectoris: Secondary | ICD-10-CM

## 2023-04-15 MED ORDER — ATORVASTATIN CALCIUM 20 MG PO TABS: 20.0000 mg | ORAL_TABLET | Freq: Every day | ORAL | 1 refills | Status: DC

## 2023-06-13 ENCOUNTER — Ambulatory Visit: Payer: BC Managed Care – PPO | Admitting: Internal Medicine

## 2023-06-13 VITALS — BP 130/80 | HR 70 | Temp 98.3°F | Ht 61.0 in | Wt 187.0 lb

## 2023-06-13 DIAGNOSIS — I2510 Atherosclerotic heart disease of native coronary artery without angina pectoris: Secondary | ICD-10-CM | POA: Diagnosis not present

## 2023-06-13 DIAGNOSIS — E7800 Pure hypercholesterolemia, unspecified: Secondary | ICD-10-CM | POA: Diagnosis not present

## 2023-06-13 DIAGNOSIS — I10 Essential (primary) hypertension: Secondary | ICD-10-CM

## 2023-06-13 DIAGNOSIS — I2584 Coronary atherosclerosis due to calcified coronary lesion: Secondary | ICD-10-CM

## 2023-06-13 DIAGNOSIS — I119 Hypertensive heart disease without heart failure: Secondary | ICD-10-CM

## 2023-06-13 DIAGNOSIS — Z6836 Body mass index (BMI) 36.0-36.9, adult: Secondary | ICD-10-CM

## 2023-06-13 DIAGNOSIS — E2839 Other primary ovarian failure: Secondary | ICD-10-CM

## 2023-06-13 NOTE — Patient Instructions (Signed)
Cholesterol Content in Foods Cholesterol is a waxy, fat-like substance that helps to carry fat in the blood. The body needs cholesterol in small amounts, but too much cholesterol can cause damage to the arteries and heart. What foods have cholesterol?  Cholesterol is found in animal-based foods, such as meat, seafood, and dairy. Generally, low-fat dairy and lean meats have less cholesterol than full-fat dairy and fatty meats. The milligrams of cholesterol per serving (mg per serving) of common cholesterol-containing foods are listed below. Meats and other proteins Egg -- one large whole egg has 186 mg. Veal shank -- 4 oz (113 g) has 141 mg. Lean ground turkey (93% lean) -- 4 oz (113 g) has 118 mg. Fat-trimmed lamb loin -- 4 oz (113 g) has 106 mg. Lean ground beef (90% lean) -- 4 oz (113 g) has 100 mg. Lobster -- 3.5 oz (99 g) has 90 mg. Pork loin chops -- 4 oz (113 g) has 86 mg. Canned salmon -- 3.5 oz (99 g) has 83 mg. Fat-trimmed beef top loin -- 4 oz (113 g) has 78 mg. Frankfurter -- 1 frank (3.5 oz or 99 g) has 77 mg. Crab -- 3.5 oz (99 g) has 71 mg. Roasted chicken without skin, white meat -- 4 oz (113 g) has 66 mg. Light bologna -- 2 oz (57 g) has 45 mg. Deli-cut turkey -- 2 oz (57 g) has 31 mg. Canned tuna -- 3.5 oz (99 g) has 31 mg. Bacon -- 1 oz (28 g) has 29 mg. Oysters and mussels (raw) -- 3.5 oz (99 g) has 25 mg. Mackerel -- 1 oz (28 g) has 22 mg. Trout -- 1 oz (28 g) has 20 mg. Pork sausage -- 1 link (1 oz or 28 g) has 17 mg. Salmon -- 1 oz (28 g) has 16 mg. Tilapia -- 1 oz (28 g) has 14 mg. Dairy Soft-serve ice cream --  cup (4 oz or 86 g) has 103 mg. Whole-milk yogurt -- 1 cup (8 oz or 245 g) has 29 mg. Cheddar cheese -- 1 oz (28 g) has 28 mg. American cheese -- 1 oz (28 g) has 28 mg. Whole milk -- 1 cup (8 oz or 250 mL) has 23 mg. 2% milk -- 1 cup (8 oz or 250 mL) has 18 mg. Cream cheese -- 1 tablespoon (Tbsp) (14.5 g) has 15 mg. Cottage cheese --  cup (4 oz or  113 g) has 14 mg. Low-fat (1%) milk -- 1 cup (8 oz or 250 mL) has 10 mg. Sour cream -- 1 Tbsp (12 g) has 8.5 mg. Low-fat yogurt -- 1 cup (8 oz or 245 g) has 8 mg. Nonfat Greek yogurt -- 1 cup (8 oz or 228 g) has 7 mg. Half-and-half cream -- 1 Tbsp (15 mL) has 5 mg. Fats and oils Cod liver oil -- 1 tablespoon (Tbsp) (13.6 g) has 82 mg. Butter -- 1 Tbsp (14 g) has 15 mg. Lard -- 1 Tbsp (12.8 g) has 14 mg. Bacon grease -- 1 Tbsp (12.9 g) has 14 mg. Mayonnaise -- 1 Tbsp (13.8 g) has 5-10 mg. Margarine -- 1 Tbsp (14 g) has 3-10 mg. The items listed above may not be a complete list of foods with cholesterol. Exact amounts of cholesterol in these foods may vary depending on specific ingredients and brands. Contact a dietitian for more information. What foods do not have cholesterol? Most plant-based foods do not have cholesterol unless you combine them with a food that has   cholesterol. Foods without cholesterol include: Grains and cereals. Vegetables. Fruits. Vegetable oils, such as olive, canola, and sunflower oil. Legumes, such as peas, beans, and lentils. Nuts and seeds. Egg whites. The items listed above may not be a complete list of foods that do not have cholesterol. Contact a dietitian for more information. Summary The body needs cholesterol in small amounts, but too much cholesterol can cause damage to the arteries and heart. Cholesterol is found in animal-based foods, such as meat, seafood, and dairy. Generally, low-fat dairy and lean meats have less cholesterol than full-fat dairy and fatty meats. This information is not intended to replace advice given to you by your health care provider. Make sure you discuss any questions you have with your health care provider. Document Revised: 02/27/2021 Document Reviewed: 02/27/2021 Elsevier Patient Education  2024 Elsevier Inc.  

## 2023-06-13 NOTE — Progress Notes (Signed)
I,Victoria T Deloria Lair, CMA,acting as a Neurosurgeon for Gwynneth Aliment, MD.,have documented all relevant documentation on the behalf of Gwynneth Aliment, MD,as directed by  Gwynneth Aliment, MD while in the presence of Gwynneth Aliment, MD.  Subjective:  Patient ID: Rita Roberts , female    DOB: 1956/04/09 , 67 y.o.   MRN: [id number]  Chief Complaint  Patient presents with   Hyperlipidemia   Hypertension    HPI  Patient presents today for a BP & cholesterol check. She reports compliance with meds. She has not had any issues with the medication. Denies cp, sob and palpitations. She reports no specific questions or concerns.   Hypertension This is a chronic problem. The current episode started more than 1 year ago. The problem has been gradually improving since onset. The problem is uncontrolled. Pertinent negatives include no blurred vision. Risk factors for coronary artery disease include post-menopausal state. Past treatments include angiotensin blockers. The current treatment provides moderate improvement.     Past Medical History:  Diagnosis Date   Anemia    Arthritis    "probably in left shoulder" (02/09/2018)   Breast cancer, left breast (HCC) 11/2017   Family history of adverse reaction to anesthesia    "my mom may have; not sure" (02/09/2018)   GERD (gastroesophageal reflux disease)    Heartburn    Pneumonia 1960     Family History  Problem Relation Age of Onset   Dementia Mother    Lung cancer Father    Healthy Sister      Current Outpatient Medications:    amLODipine (NORVASC) 5 MG tablet, Take 1 tablet (5 mg total) by mouth daily., Disp: 90 tablet, Rfl: 2   atorvastatin (LIPITOR) 20 MG tablet, Take 1 tablet (20 mg total) by mouth daily., Disp: 90 tablet, Rfl: 1   ibuprofen (ADVIL,MOTRIN) 200 MG tablet, Take 400 mg by mouth daily as needed for headache or moderate pain., Disp: , Rfl:    valsartan (DIOVAN) 160 MG tablet, TAKE 1 TABLET(160 MG) BY MOUTH DAILY, Disp: 90 tablet,  Rfl: 2   No Known Allergies   Review of Systems  Constitutional: Negative.   Eyes:  Negative for blurred vision.  Respiratory: Negative.    Cardiovascular: Negative.   Neurological: Negative.   Psychiatric/Behavioral: Negative.       Today's Vitals   06/13/23 1601 06/13/23 1617  BP: (!) 150/98 130/80  Pulse: 70   Temp: 98.3 F (36.8 C)   SpO2: 98%   Weight: 187 lb (84.8 kg)   Height: 5\' 1"  (1.549 m)    Body mass index is 35.33 kg/m.  Wt Readings from Last 3 Encounters:  06/13/23 187 lb (84.8 kg)  03/24/23 191 lb 8 oz (86.9 kg)  11/30/22 194 lb (88 kg)     Objective:  Physical Exam Vitals and nursing note reviewed.  Constitutional:      Appearance: Normal appearance. She is obese.  HENT:     Head: Normocephalic and atraumatic.     Left Ear: Tympanic membrane normal.  Eyes:     Extraocular Movements: Extraocular movements intact.  Cardiovascular:     Rate and Rhythm: Normal rate and regular rhythm.     Heart sounds: Normal heart sounds.  Pulmonary:     Effort: Pulmonary effort is normal.     Breath sounds: Normal breath sounds.  Musculoskeletal:     Cervical back: Normal range of motion.  Skin:    General: Skin is warm.  Neurological:     General: No focal deficit present.     Mental Status: She is alert.  Psychiatric:        Mood and Affect: Mood normal.        Behavior: Behavior normal.         Assessment And Plan:  Pure hypercholesterolemia Assessment & Plan: Chronic, LDL goal <70 due to coronary artery calcifications. She is currently taking atorvastatin 20mg  daily.    Hypertensive heart disease without heart failure Assessment & Plan: Chronic, fair control. Goal BP < 120/80.  She will continue with amlodipine 5mg  and valsartan 160mg  daily. She is encouraged to follow low sodium diet.   Orders: -     Lipid panel -     ALT  Coronary artery calcification of native artery Assessment & Plan: Calcium score is 95. She does not wish to seek  Cardiology evaluation at this time. She agrees to c/w atorvastatin 20mg  daily for now. She is encouraged to follow a heart healthy lifestyle.   Orders: -     Lipid panel -     ALT  Decreased estrogen level Assessment & Plan: She is encouraged to engage in weight-bearing exercises at least three days per week. I will order bone density.   Orders: -     DG Bone Density; Future  Class 2 severe obesity due to excess calories with serious comorbidity and body mass index (BMI) of 36.0 to 36.9 in adult [facility]) Assessment & Plan: She is encouraged to strive for BMI less than 30 to decrease cardiac risk. Advised to aim for at least 150 minutes of exercise per week.       Return if symptoms worsen or fail to improve.  Patient was given opportunity to ask questions. Patient verbalized understanding of the plan and was able to repeat key elements of the plan. All questions were answered to their satisfaction.    I, Gwynneth Aliment, MD, have reviewed all documentation for this visit. The documentation on 06/13/23 for the exam, diagnosis, procedures, and orders are all accurate and complete.   IF YOU HAVE BEEN REFERRED TO A SPECIALIST, IT MAY TAKE 1-2 WEEKS TO SCHEDULE/PROCESS THE REFERRAL. IF YOU HAVE NOT HEARD FROM US/SPECIALIST IN TWO WEEKS, PLEASE GIVE [place] A CALL AT [phone].   THE PATIENT IS ENCOURAGED TO PRACTICE SOCIAL DISTANCING DUE TO THE COVID-19 PANDEMIC.

## 2023-06-18 DIAGNOSIS — I2510 Atherosclerotic heart disease of native coronary artery without angina pectoris: Secondary | ICD-10-CM | POA: Insufficient documentation

## 2023-06-18 DIAGNOSIS — E2839 Other primary ovarian failure: Secondary | ICD-10-CM | POA: Insufficient documentation

## 2023-06-18 NOTE — Assessment & Plan Note (Signed)
She is encouraged to strive for BMI less than 30 to decrease cardiac risk. Advised to aim for at least 150 minutes of exercise per week.  

## 2023-06-18 NOTE — Assessment & Plan Note (Signed)
Chronic, fair control. Goal BP < 120/80.  She will continue with amlodipine 5mg  and valsartan 160mg  daily. She is encouraged to follow low sodium diet.

## 2023-06-18 NOTE — Assessment & Plan Note (Signed)
She is encouraged to engage in weight-bearing exercises at least three days per week. I will order bone density.

## 2023-06-18 NOTE — Assessment & Plan Note (Signed)
Calcium score is 95. She does not wish to seek Cardiology evaluation at this time. She agrees to c/w atorvastatin 20mg  daily for now. She is encouraged to follow a heart healthy lifestyle.

## 2023-06-18 NOTE — Assessment & Plan Note (Signed)
Chronic, LDL goal <70 due to coronary artery calcifications. She is currently taking atorvastatin 20mg  daily.

## 2023-09-26 ENCOUNTER — Ambulatory Visit: Payer: BC Managed Care – PPO | Admitting: Internal Medicine

## 2023-09-26 ENCOUNTER — Encounter: Payer: Self-pay | Admitting: Internal Medicine

## 2023-09-26 VITALS — BP 134/86 | HR 68 | Temp 98.2°F | Ht 61.0 in | Wt 187.2 lb

## 2023-09-26 DIAGNOSIS — I119 Hypertensive heart disease without heart failure: Secondary | ICD-10-CM | POA: Insufficient documentation

## 2023-09-26 DIAGNOSIS — Z2821 Immunization not carried out because of patient refusal: Secondary | ICD-10-CM

## 2023-09-26 DIAGNOSIS — Z6835 Body mass index (BMI) 35.0-35.9, adult: Secondary | ICD-10-CM

## 2023-09-26 DIAGNOSIS — I2584 Coronary atherosclerosis due to calcified coronary lesion: Secondary | ICD-10-CM

## 2023-09-26 DIAGNOSIS — I2510 Atherosclerotic heart disease of native coronary artery without angina pectoris: Secondary | ICD-10-CM | POA: Diagnosis not present

## 2023-09-26 DIAGNOSIS — E66812 Obesity, class 2: Secondary | ICD-10-CM

## 2023-09-26 DIAGNOSIS — E7800 Pure hypercholesterolemia, unspecified: Secondary | ICD-10-CM

## 2023-09-26 DIAGNOSIS — R0982 Postnasal drip: Secondary | ICD-10-CM | POA: Diagnosis not present

## 2023-09-26 MED ORDER — ATORVASTATIN CALCIUM 20 MG PO TABS: 20.0000 mg | ORAL_TABLET | Freq: Every day | ORAL | 1 refills | Status: DC

## 2023-09-26 MED ORDER — AMLODIPINE BESYLATE 5 MG PO TABS
5.0000 mg | ORAL_TABLET | Freq: Every day | ORAL | 2 refills | Status: AC
Start: 2023-09-26 — End: 2024-09-25

## 2023-09-26 MED ORDER — VALSARTAN 160 MG PO TABS
ORAL_TABLET | ORAL | 2 refills | Status: DC
Start: 2023-09-26 — End: 2024-06-20

## 2023-09-26 MED ORDER — ASPIRIN 81 MG PO TBEC
81.0000 mg | DELAYED_RELEASE_TABLET | Freq: Every day | ORAL | Status: AC
Start: 2023-09-26 — End: 2024-09-25

## 2023-09-26 NOTE — Assessment & Plan Note (Signed)
Calcium score is 95. She does not wish to seek Cardiology evaluation at this time. We discussed LDL goal is less than 70. She prefers to work on lifestyle modification instead of increasing the dose. Encouraged to take ASA and statin daily.

## 2023-09-26 NOTE — Patient Instructions (Signed)
Hypertension, Adult Hypertension is another name for high blood pressure. High blood pressure forces your heart to work harder to pump blood. This can cause problems over time. There are two numbers in a blood pressure reading. There is a top number (systolic) over a bottom number (diastolic). It is best to have a blood pressure that is below 120/80. What are the causes? The cause of this condition is not known. Some other conditions can lead to high blood pressure. What increases the risk? Some lifestyle factors can make you more likely to develop high blood pressure: Smoking. Not getting enough exercise or physical activity. Being overweight. Having too much fat, sugar, calories, or salt (sodium) in your diet. Drinking too much alcohol. Other risk factors include: Having any of these conditions: Heart disease. Diabetes. High cholesterol. Kidney disease. Obstructive sleep apnea. Having a family history of high blood pressure and high cholesterol. Age. The risk increases with age. Stress. What are the signs or symptoms? High blood pressure may not cause symptoms. Very high blood pressure (hypertensive crisis) may cause: Headache. Fast or uneven heartbeats (palpitations). Shortness of breath. Nosebleed. Vomiting or feeling like you may vomit (nauseous). Changes in how you see. Very bad chest pain. Feeling dizzy. Seizures. How is this treated? This condition is treated by making healthy lifestyle changes, such as: Eating healthy foods. Exercising more. Drinking less alcohol. Your doctor may prescribe medicine if lifestyle changes do not help enough and if: Your top number is above 130. Your bottom number is above 80. Your personal target blood pressure may vary. Follow these instructions at home: Eating and drinking  If told, follow the DASH eating plan. To follow this plan: Fill one half of your plate at each meal with fruits and vegetables. Fill one fourth of your plate  at each meal with whole grains. Whole grains include whole-wheat pasta, brown rice, and whole-grain bread. Eat or drink low-fat dairy products, such as skim milk or low-fat yogurt. Fill one fourth of your plate at each meal with low-fat (lean) proteins. Low-fat proteins include fish, chicken without skin, eggs, beans, and tofu. Avoid fatty meat, cured and processed meat, or chicken with skin. Avoid pre-made or processed food. Limit the amount of salt in your diet to less than 1,500 mg each day. Do not drink alcohol if: Your doctor tells you not to drink. You are pregnant, may be pregnant, or are planning to become pregnant. If you drink alcohol: Limit how much you have to: 0-1 drink a day for women. 0-2 drinks a day for men. Know how much alcohol is in your drink. In the [place] one drink equals one 12 oz bottle of beer (355 mL), one 5 oz glass of wine (148 mL), or one 1 oz glass of hard liquor (44 mL). Lifestyle  Work with your doctor to stay at a healthy weight or to lose weight. Ask your doctor what the best weight is for you. Get at least 30 minutes of exercise that causes your heart to beat faster (aerobic exercise) most days of the week. This may include walking, swimming, or biking. Get at least 30 minutes of exercise that strengthens your muscles (resistance exercise) at least 3 days a week. This may include lifting weights or doing Pilates. Do not smoke or use any products that contain nicotine or tobacco. If you need help quitting, ask your doctor. Check your blood pressure at home as told by your doctor. Keep all follow-up visits. Medicines Take over-the-counter and prescription medicines  only as told by your doctor. Follow directions carefully. Do not skip doses of blood pressure medicine. The medicine does not work as well if you skip doses. Skipping doses also puts you at risk for problems. Ask your doctor about side effects or reactions to medicines that you should watch  for. Contact a doctor if: You think you are having a reaction to the medicine you are taking. You have headaches that keep coming back. You feel dizzy. You have swelling in your ankles. You have trouble with your vision. Get help right away if: You get a very bad headache. You start to feel mixed up (confused). You feel weak or numb. You feel faint. You have very bad pain in your: Chest. Belly (abdomen). You vomit more than once. You have trouble breathing. These symptoms may be an emergency. Get help right away. Call 911. Do not wait to see if the symptoms will go away. Do not drive yourself to the hospital. Summary Hypertension is another name for high blood pressure. High blood pressure forces your heart to work harder to pump blood. For most people, a normal blood pressure is less than 120/80. Making healthy choices can help lower blood pressure. If your blood pressure does not get lower with healthy choices, you may need to take medicine. This information is not intended to replace advice given to you by your health care provider. Make sure you discuss any questions you have with your health care provider. Document Revised: 08/06/2021 Document Reviewed: 08/06/2021 Elsevier Patient Education  2024 [employer].

## 2023-09-26 NOTE — Assessment & Plan Note (Signed)
Chronic, fair control. Goal BP<120/80. She will continue with valsartan 160mg  and amlodipine 5mg  daily. She should take valsartan in AM and amlodipine in PM. She will f/u in four months.

## 2023-09-26 NOTE — Progress Notes (Signed)
I,Victoria T Deloria Lair, CMA,acting as a Neurosurgeon for Gwynneth Aliment, MD.,have documented all relevant documentation on the behalf of Gwynneth Aliment, MD,as directed by  Gwynneth Aliment, MD while in the presence of Gwynneth Aliment, MD.  Subjective:  Patient ID: Rita Roberts , female    DOB: [birth year] , 67 y.o.   MRN: [id number]  Chief Complaint  Patient presents with   Hypertension   Hyperlipidemia    HPI  Patient presents today for a BP check. She reports compliance with medications. Denies headache, chest pain & sob. She denies having any specific questions or concerns.   Hypertension This is a chronic problem. The current episode started more than 1 year ago. The problem has been gradually improving since onset. The problem is uncontrolled. Pertinent negatives include no blurred vision. Risk factors for coronary artery disease include post-menopausal state. Past treatments include angiotensin blockers. The current treatment provides moderate improvement.     Past Medical History:  Diagnosis Date   Anemia    Arthritis    "probably in left shoulder" (02/09/2018)   Breast cancer, left breast (HCC) 11/2017   Family history of adverse reaction to anesthesia    "my mom may have; not sure" (02/09/2018)   GERD (gastroesophageal reflux disease)    Heartburn    Pneumonia 1960     Family History  Problem Relation Age of Onset   Dementia Mother    Lung cancer Father    Healthy Sister      Current Outpatient Medications:    aspirin EC 81 MG tablet, Take 1 tablet (81 mg total) by mouth daily. Swallow whole., Disp: , Rfl:    ibuprofen (ADVIL,MOTRIN) 200 MG tablet, Take 400 mg by mouth daily as needed for headache or moderate pain., Disp: , Rfl:    amLODipine (NORVASC) 5 MG tablet, Take 1 tablet (5 mg total) by mouth daily., Disp: 90 tablet, Rfl: 2   atorvastatin (LIPITOR) 20 MG tablet, Take 1 tablet (20 mg total) by mouth daily., Disp: 90 tablet, Rfl: 1   valsartan (DIOVAN) 160 MG tablet,  TAKE 1 TABLET(160 MG) BY MOUTH DAILY, Disp: 90 tablet, Rfl: 2   No Known Allergies   Review of Systems  Constitutional: Negative.   HENT:  Positive for postnasal drip.   Eyes: Negative.  Negative for blurred vision.  Respiratory: Negative.    Cardiovascular: Negative.   Gastrointestinal: Negative.   Skin: Negative.      Today's Vitals   09/26/23 0925  BP: 134/86  Pulse: 68  Temp: 98.2 F (36.8 C)  SpO2: 98%  Weight: 187 lb 3.2 oz (84.9 kg)  Height: 5\' 1"  (1.549 m)   Body mass index is 35.37 kg/m.  Wt Readings from Last 3 Encounters:  09/26/23 187 lb 3.2 oz (84.9 kg)  06/13/23 187 lb (84.8 kg)  03/24/23 191 lb 8 oz (86.9 kg)     Objective:  Physical Exam Vitals and nursing note reviewed.  Constitutional:      Appearance: Normal appearance.  HENT:     Head: Normocephalic and atraumatic.  Eyes:     Extraocular Movements: Extraocular movements intact.  Cardiovascular:     Rate and Rhythm: Normal rate and regular rhythm.     Heart sounds: Normal heart sounds.  Pulmonary:     Effort: Pulmonary effort is normal.     Breath sounds: Normal breath sounds.  Musculoskeletal:     Cervical back: Normal range of motion.  Skin:    General:  Skin is warm.  Neurological:     General: No focal deficit present.     Mental Status: She is alert.  Psychiatric:        Mood and Affect: Mood normal.        Behavior: Behavior normal.         Assessment And Plan:  Hypertensive heart disease without heart failure Assessment & Plan: Chronic, fair control. Goal BP<120/80. She will continue with valsartan 160mg  and amlodipine 5mg  daily. She should take valsartan in AM and amlodipine in PM. She will f/u in four months.   Orders: -     BMP8+EGFR -     Valsartan; TAKE 1 TABLET(160 MG) BY MOUTH DAILY  Dispense: 90 tablet; Refill: 2 -     amLODIPine Besylate; Take 1 tablet (5 mg total) by mouth daily.  Dispense: 90 tablet; Refill: 2  Coronary artery calcification of native  artery Assessment & Plan: Calcium score is 95. She does not wish to seek Cardiology evaluation at this time. We discussed LDL goal is less than 70. She prefers to work on lifestyle modification instead of increasing the dose. Encouraged to take ASA and statin daily.  Orders: -     Aspirin; Take 1 tablet (81 mg total) by mouth daily. Swallow whole.  Postnasal drip Assessment & Plan: She c/o sinus drainage, thinks sx are due to change in weather. She does not wish to take any medication for this. Advised to avoid dairy and drink at least one hot beverage daily. She can try loratadine or cetirizine if her sx persist/worsen.    Pure hypercholesterolemia Assessment & Plan: Chronic, LDL goal is less than 70 given coronary artery calcification.  She would like to work on lifestyle modification first, prior to increasing the dose of her meds. She agrees to rto in four months for re-evaluation.    Class 2 severe obesity due to excess calories with serious comorbidity and body mass index (BMI) of 35.0 to 35.9 in adult [facility]) Assessment & Plan: She is encouraged to strive for BMI less than 30 to decrease cardiac risk. Advised to aim for at least 150 minutes of exercise per week.    Immunization declined  Other orders -     Atorvastatin Calcium; Take 1 tablet (20 mg total) by mouth daily.  Dispense: 90 tablet; Refill: 1  She is encouraged to strive for BMI less than 30 to decrease cardiac risk. Advised to aim for at least 150 minutes of exercise per week.    Return in 4 months (on 01/24/2024), or chol check.  Patient was given opportunity to ask questions. Patient verbalized understanding of the plan and was able to repeat key elements of the plan. All questions were answered to their satisfaction.    I, Gwynneth Aliment, MD, have reviewed all documentation for this visit. The documentation on 09/26/23 for the exam, diagnosis, procedures, and orders are all accurate and complete.   IF YOU HAVE  BEEN REFERRED TO A SPECIALIST, IT MAY TAKE 1-2 WEEKS TO SCHEDULE/PROCESS THE REFERRAL. IF YOU HAVE NOT HEARD FROM US/SPECIALIST IN TWO WEEKS, PLEASE GIVE [place] A CALL AT [phone] X [phone].   THE PATIENT IS ENCOURAGED TO PRACTICE SOCIAL DISTANCING DUE TO THE COVID-19 PANDEMIC.

## 2023-09-26 NOTE — Assessment & Plan Note (Signed)
She is encouraged to strive for BMI less than 30 to decrease cardiac risk. Advised to aim for at least 150 minutes of exercise per week.

## 2023-09-26 NOTE — Assessment & Plan Note (Signed)
Chronic, LDL goal is less than 70 given coronary artery calcification.  She would like to work on lifestyle modification first, prior to increasing the dose of her meds. She agrees to rto in four months for re-evaluation.

## 2023-09-26 NOTE — Assessment & Plan Note (Signed)
She c/o sinus drainage, thinks sx are due to change in weather. She does not wish to take any medication for this. Advised to avoid dairy and drink at least one hot beverage daily. She can try loratadine or cetirizine if her sx persist/worsen.

## 2023-09-27 LAB — BMP8+EGFR
BUN/Creatinine Ratio: 19 (ref 12–28)
BUN: 15 mg/dL (ref 8–27)
CO2: 23 mmol/L (ref 20–29)
Calcium: 9.3 mg/dL (ref 8.7–10.3)
Chloride: 105 mmol/L (ref 96–106)
Creatinine, Ser: 0.79 mg/dL (ref 0.57–1.00)
Glucose: 100 mg/dL — ABNORMAL HIGH (ref 70–99)
Potassium: 4.4 mmol/L (ref 3.5–5.2)
Sodium: 141 mmol/L (ref 134–144)
eGFR: 83 mL/min/{1.73_m2} (ref >59)

## 2023-11-07 ENCOUNTER — Encounter: Payer: Self-pay | Admitting: Internal Medicine

## 2023-12-28 LAB — HM MAMMOGRAPHY

## 2024-01-05 LAB — HM PAP SMEAR

## 2024-02-02 ENCOUNTER — Ambulatory Visit: Admitting: Internal Medicine

## 2024-02-02 ENCOUNTER — Encounter: Payer: Self-pay | Admitting: Internal Medicine

## 2024-02-02 VITALS — BP 140/90 | HR 66 | Temp 98.6°F | Ht 61.0 in | Wt 189.8 lb

## 2024-02-02 DIAGNOSIS — I2510 Atherosclerotic heart disease of native coronary artery without angina pectoris: Secondary | ICD-10-CM | POA: Diagnosis not present

## 2024-02-02 DIAGNOSIS — I119 Hypertensive heart disease without heart failure: Secondary | ICD-10-CM | POA: Diagnosis not present

## 2024-02-02 DIAGNOSIS — I2584 Coronary atherosclerosis due to calcified coronary lesion: Secondary | ICD-10-CM | POA: Diagnosis not present

## 2024-02-02 DIAGNOSIS — Z6835 Body mass index (BMI) 35.0-35.9, adult: Secondary | ICD-10-CM

## 2024-02-02 DIAGNOSIS — E7800 Pure hypercholesterolemia, unspecified: Secondary | ICD-10-CM | POA: Diagnosis not present

## 2024-02-02 DIAGNOSIS — E66812 Obesity, class 2: Secondary | ICD-10-CM

## 2024-02-02 MED ORDER — BD ASSURE BPM/DELUXE ARM CUFF MISC: 0 refills | Status: AC

## 2024-02-02 NOTE — Assessment & Plan Note (Addendum)
 Calcium score is 95. She was referred to Cardiology June 2024; however, she did not make appt.  LDL goal is less than 70. She is currently taking atorvastatin 20mg  daily. She is encouraged to take ASA and statin daily.

## 2024-02-02 NOTE — Assessment & Plan Note (Signed)
 Chronic, LDL goal is less than 70 due to underlying coronary artery calcifications. Cardiology input is appreciated. She will continue with atorvastatin 20mg  daily. Check labs as below.

## 2024-02-02 NOTE — Assessment & Plan Note (Signed)
 Chronic, uncontrolled.  She will continue with valsartan 160mg  and amlodipine 5mg  daily. She should take valsartan in AM and amlodipine in PM. She will f/u in four months.   Blood pressure elevated, possibly due to caffeine sensitivity from mushroom coffee. - Send prescription for an arm blood pressure cuff to [facility] in [place] in [place]. - Instruct her to monitor blood pressure at home regularly.

## 2024-02-02 NOTE — Assessment & Plan Note (Signed)
 She is encouraged to strive for BMI less than 30 to decrease cardiac risk. Advised to aim for at least 150 minutes of exercise per week.

## 2024-02-02 NOTE — Progress Notes (Signed)
 I,Victoria T Deloria Lair, CMA,acting as a Neurosurgeon for Gwynneth Aliment, MD.,have documented all relevant documentation on the behalf of Gwynneth Aliment, MD,as directed by  Gwynneth Aliment, MD while in the presence of Gwynneth Aliment, MD.  Subjective:  Patient ID: Rita Roberts , female    DOB: [birth year] , 68 y.o.   MRN: [id number]  Chief Complaint  Patient presents with   Hypertension    Patient presents today for bp & cholesterol follow up. She reports compliance with medications. Denies headache, chest pain & sob. Letter sent for pap result.    Hyperlipidemia    HPI Discussed the use of AI scribe software for clinical note transcription with the patient, who gave verbal consent to proceed.  History of Present Illness Rita Roberts is a 68 year old female who presents for routine follow-up and lab work.  It has been five months since her last lab work, and she is due for liver and kidney function tests.  Her blood pressure is elevated today, which she attributes to drinking mushroom coffee this morning. She began consuming it about three months ago, not daily, as it was suggested to help relieve stress, but she does not feel any different. She is not currently monitoring her blood pressure at home.  She is currently taking atorvastatin daily, amlodipine, and valsartan. She had oatmeal with cinnamon and [place] sausage for breakfast today.  She mentions having had a Pap smear on February 26th at [facility] with Dr. Normand Sloop.    Hypertension This is a chronic problem. The current episode started more than 1 year ago. The problem has been gradually improving since onset. The problem is uncontrolled. Pertinent negatives include no blurred vision. Risk factors for coronary artery disease include post-menopausal state. Past treatments include angiotensin blockers. The current treatment provides moderate improvement.     Past Medical History:  Diagnosis Date   Anemia    Arthritis     "probably in left shoulder" (02/09/2018)   Breast cancer, left breast (HCC) 11/2017   Family history of adverse reaction to anesthesia    "my mom may have; not sure" (02/09/2018)   GERD (gastroesophageal reflux disease)    Heartburn    Pneumonia 1960     Family History  Problem Relation Age of Onset   Dementia Mother    Lung cancer Father    Healthy Sister      Current Outpatient Medications:    Blood Pressure Monitoring (B-D ASSURE BPM/DELUXE ARM CUFF) MISC, Use to check bp daily, Disp: 1 each, Rfl: 0   amLODipine (NORVASC) 5 MG tablet, Take 1 tablet (5 mg total) by mouth daily., Disp: 90 tablet, Rfl: 2   aspirin EC 81 MG tablet, Take 1 tablet (81 mg total) by mouth daily. Swallow whole., Disp: , Rfl:    atorvastatin (LIPITOR) 20 MG tablet, Take 1 tablet (20 mg total) by mouth daily., Disp: 90 tablet, Rfl: 1   ibuprofen (ADVIL,MOTRIN) 200 MG tablet, Take 400 mg by mouth daily as needed for headache or moderate pain., Disp: , Rfl:    valsartan (DIOVAN) 160 MG tablet, TAKE 1 TABLET(160 MG) BY MOUTH DAILY, Disp: 90 tablet, Rfl: 2   No Known Allergies   Review of Systems  Constitutional: Negative.   Eyes:  Negative for blurred vision.  Respiratory: Negative.    Cardiovascular: Negative.   Gastrointestinal: Negative.   Neurological: Negative.   Psychiatric/Behavioral: Negative.       Today's Vitals   02/02/24  1001 02/02/24 1023  BP: (!) 150/100 (!) 140/90  Pulse: 66   Temp: 98.6 F (37 C)   SpO2: 98%   Weight: 189 lb 12.8 oz (86.1 kg)   Height: 5\' 1"  (1.549 m)    Body mass index is 35.86 kg/m.  Wt Readings from Last 3 Encounters:  02/02/24 189 lb 12.8 oz (86.1 kg)  09/26/23 187 lb 3.2 oz (84.9 kg)  06/13/23 187 lb (84.8 kg)    BP Readings from Last 3 Encounters:  02/02/24 (!) 140/90  09/26/23 134/86  06/13/23 130/80     Objective:  Physical Exam Vitals and nursing note reviewed.  Constitutional:      Appearance: Normal appearance.  HENT:     Head:  Normocephalic and atraumatic.  Eyes:     Extraocular Movements: Extraocular movements intact.  Cardiovascular:     Rate and Rhythm: Normal rate and regular rhythm.     Heart sounds: Normal heart sounds.  Pulmonary:     Effort: Pulmonary effort is normal.     Breath sounds: Normal breath sounds.  Musculoskeletal:     Cervical back: Normal range of motion.  Skin:    General: Skin is warm.  Neurological:     General: No focal deficit present.     Mental Status: She is alert.  Psychiatric:        Mood and Affect: Mood normal.        Behavior: Behavior normal.         Assessment And Plan:  Hypertensive heart disease without heart failure Assessment & Plan: Chronic, uncontrolled.  She will continue with valsartan 160mg  and amlodipine 5mg  daily. She should take valsartan in AM and amlodipine in PM. She will f/u in four months.   Blood pressure elevated, possibly due to caffeine sensitivity from mushroom coffee. - Send prescription for an arm blood pressure cuff to [facility] in [place] in [place]. - Instruct her to monitor blood pressure at home regularly.  Orders: -     CMP14+EGFR  Pure hypercholesterolemia Assessment & Plan: Chronic, LDL goal is less than 70 due to underlying coronary artery calcifications. Cardiology input is appreciated. She will continue with atorvastatin 20mg  daily. Check labs as below.   Orders: -     CMP14+EGFR -     Lipid panel  Coronary artery calcification of native artery Assessment & Plan: Calcium score is 95. She was referred to Cardiology June 2024; however, she did not make appt.  LDL goal is less than 70. She is currently taking atorvastatin 20mg  daily. She is encouraged to take ASA and statin daily.   Class 2 severe obesity due to excess calories with serious comorbidity and body mass index (BMI) of 35.0 to 35.9 in adult [facility]) Assessment & Plan: She is encouraged to strive for BMI less than 30 to decrease cardiac risk. Advised to aim for  at least 150 minutes of exercise per week.    Other orders -     BD Assure BPM/Deluxe Arm Cuff; Use to check bp daily  Dispense: 1 each; Refill: 0  General Health Maintenance Pap smear conducted on December 28, 2023. Bone density scan scheduled for next week. Physical exam to be rescheduled due to vacation. - Perform liver and kidney function tests today. - Reschedule physical exam to July or August.   Return move phys to July or Aug.  Patient was given opportunity to ask questions. Patient verbalized understanding of the plan and was able to repeat key elements of  the plan. All questions were answered to their satisfaction.    I, Gwynneth Aliment, MD, have reviewed all documentation for this visit. The documentation on 02/02/24 for the exam, diagnosis, procedures, and orders are all accurate and complete.   IF YOU HAVE BEEN REFERRED TO A SPECIALIST, IT MAY TAKE 1-2 WEEKS TO SCHEDULE/PROCESS THE REFERRAL. IF YOU HAVE NOT HEARD FROM US/SPECIALIST IN TWO WEEKS, PLEASE GIVE [place] A CALL AT [phone].   THE PATIENT IS ENCOURAGED TO PRACTICE SOCIAL DISTANCING DUE TO THE COVID-19 PANDEMIC.

## 2024-02-02 NOTE — Patient Instructions (Signed)
 Hypertension, Adult Hypertension is another name for high blood pressure. High blood pressure forces your heart to work harder to pump blood. This can cause problems over time. There are two numbers in a blood pressure reading. There is a top number (systolic) over a bottom number (diastolic). It is best to have a blood pressure that is below 120/80. What are the causes? The cause of this condition is not known. Some other conditions can lead to high blood pressure. What increases the risk? Some lifestyle factors can make you more likely to develop high blood pressure: Smoking. Not getting enough exercise or physical activity. Being overweight. Having too much fat, sugar, calories, or salt (sodium) in your diet. Drinking too much alcohol. Other risk factors include: Having any of these conditions: Heart disease. Diabetes. High cholesterol. Kidney disease. Obstructive sleep apnea. Having a family history of high blood pressure and high cholesterol. Age. The risk increases with age. Stress. What are the signs or symptoms? High blood pressure may not cause symptoms. Very high blood pressure (hypertensive crisis) may cause: Headache. Fast or uneven heartbeats (palpitations). Shortness of breath. Nosebleed. Vomiting or feeling like you may vomit (nauseous). Changes in how you see. Very bad chest pain. Feeling dizzy. Seizures. How is this treated? This condition is treated by making healthy lifestyle changes, such as: Eating healthy foods. Exercising more. Drinking less alcohol. Your doctor may prescribe medicine if lifestyle changes do not help enough and if: Your top number is above 130. Your bottom number is above 80. Your personal target blood pressure may vary. Follow these instructions at home: Eating and drinking  If told, follow the DASH eating plan. To follow this plan: Fill one half of your plate at each meal with fruits and vegetables. Fill one fourth of your plate  at each meal with whole grains. Whole grains include whole-wheat pasta, brown rice, and whole-grain bread. Eat or drink low-fat dairy products, such as skim milk or low-fat yogurt. Fill one fourth of your plate at each meal with low-fat (lean) proteins. Low-fat proteins include fish, chicken without skin, eggs, beans, and tofu. Avoid fatty meat, cured and processed meat, or chicken with skin. Avoid pre-made or processed food. Limit the amount of salt in your diet to less than 1,500 mg each day. Do not drink alcohol if: Your doctor tells you not to drink. You are pregnant, may be pregnant, or are planning to become pregnant. If you drink alcohol: Limit how much you have to: 0-1 drink a day for women. 0-2 drinks a day for men. Know how much alcohol is in your drink. In the [place] one drink equals one 12 oz bottle of beer (355 mL), one 5 oz glass of wine (148 mL), or one 1 oz glass of hard liquor (44 mL). Lifestyle  Work with your doctor to stay at a healthy weight or to lose weight. Ask your doctor what the best weight is for you. Get at least 30 minutes of exercise that causes your heart to beat faster (aerobic exercise) most days of the week. This may include walking, swimming, or biking. Get at least 30 minutes of exercise that strengthens your muscles (resistance exercise) at least 3 days a week. This may include lifting weights or doing Pilates. Do not smoke or use any products that contain nicotine or tobacco. If you need help quitting, ask your doctor. Check your blood pressure at home as told by your doctor. Keep all follow-up visits. Medicines Take over-the-counter and prescription medicines  only as told by your doctor. Follow directions carefully. Do not skip doses of blood pressure medicine. The medicine does not work as well if you skip doses. Skipping doses also puts you at risk for problems. Ask your doctor about side effects or reactions to medicines that you should watch  for. Contact a doctor if: You think you are having a reaction to the medicine you are taking. You have headaches that keep coming back. You feel dizzy. You have swelling in your ankles. You have trouble with your vision. Get help right away if: You get a very bad headache. You start to feel mixed up (confused). You feel weak or numb. You feel faint. You have very bad pain in your: Chest. Belly (abdomen). You vomit more than once. You have trouble breathing. These symptoms may be an emergency. Get help right away. Call 911. Do not wait to see if the symptoms will go away. Do not drive yourself to the hospital. Summary Hypertension is another name for high blood pressure. High blood pressure forces your heart to work harder to pump blood. For most people, a normal blood pressure is less than 120/80. Making healthy choices can help lower blood pressure. If your blood pressure does not get lower with healthy choices, you may need to take medicine. This information is not intended to replace advice given to you by your health care provider. Make sure you discuss any questions you have with your health care provider. Document Revised: 08/06/2021 Document Reviewed: 08/06/2021 Elsevier Patient Education  2024 [employer].

## 2024-02-03 LAB — CMP14+EGFR
ALT: 16 IU/L (ref 0–32)
AST: 20 IU/L (ref 0–40)
Albumin: 4.1 g/dL (ref 3.9–4.9)
Alkaline Phosphatase: 77 IU/L (ref 44–121)
BUN/Creatinine Ratio: 17 (ref 12–28)
BUN: 14 mg/dL (ref 8–27)
Bilirubin Total: 0.8 mg/dL (ref 0.0–1.2)
CO2: 21 mmol/L (ref 20–29)
Calcium: 9.7 mg/dL (ref 8.7–10.3)
Chloride: 107 mmol/L — ABNORMAL HIGH (ref 96–106)
Creatinine, Ser: 0.81 mg/dL (ref 0.57–1.00)
Globulin, Total: 2.5 g/dL (ref 1.5–4.5)
Glucose: 85 mg/dL (ref 70–99)
Potassium: 4.4 mmol/L (ref 3.5–5.2)
Sodium: 142 mmol/L (ref 134–144)
Total Protein: 6.6 g/dL (ref 6.0–8.5)
eGFR: 81 mL/min/{1.73_m2} (ref >59)

## 2024-02-03 LAB — LIPID PANEL
Chol/HDL Ratio: 4.5 ratio — ABNORMAL HIGH (ref 0.0–4.4)
Cholesterol, Total: 189 mg/dL (ref 100–199)
HDL: 42 mg/dL (ref >39)
LDL Chol Calc (NIH): 136 mg/dL — ABNORMAL HIGH (ref 0–99)
Triglycerides: 57 mg/dL (ref 0–149)
VLDL Cholesterol Cal: 11 mg/dL (ref 5–40)

## 2024-02-04 ENCOUNTER — Encounter: Payer: Self-pay | Admitting: Internal Medicine

## 2024-02-06 ENCOUNTER — Ambulatory Visit: Payer: BC Managed Care – PPO | Admitting: Internal Medicine

## 2024-02-09 ENCOUNTER — Ambulatory Visit: Payer: BC Managed Care – PPO | Admitting: Internal Medicine

## 2024-02-10 ENCOUNTER — Ambulatory Visit
Admission: RE | Admit: 2024-02-10 | Discharge: 2024-02-10 | Disposition: A | Discharge status: Home / Self Care | Payer: BC Managed Care – PPO | Source: Ambulatory Visit | Attending: Internal Medicine | Admitting: Internal Medicine

## 2024-02-10 DIAGNOSIS — E2839 Other primary ovarian failure: Secondary | ICD-10-CM

## 2024-02-13 ENCOUNTER — Encounter: Payer: Self-pay | Admitting: Internal Medicine

## 2024-04-05 ENCOUNTER — Encounter: Payer: Self-pay | Admitting: Internal Medicine

## 2024-06-06 ENCOUNTER — Ambulatory Visit (INDEPENDENT_AMBULATORY_CARE_PROVIDER_SITE_OTHER): Admitting: Internal Medicine

## 2024-06-06 ENCOUNTER — Encounter: Payer: Self-pay | Admitting: Internal Medicine

## 2024-06-06 VITALS — BP 148/90 | HR 66 | Temp 98.9°F | Ht 61.0 in | Wt 184.0 lb

## 2024-06-06 DIAGNOSIS — E66811 Obesity, class 1: Secondary | ICD-10-CM

## 2024-06-06 DIAGNOSIS — I2510 Atherosclerotic heart disease of native coronary artery without angina pectoris: Secondary | ICD-10-CM | POA: Diagnosis not present

## 2024-06-06 DIAGNOSIS — Z6834 Body mass index (BMI) 34.0-34.9, adult: Secondary | ICD-10-CM

## 2024-06-06 DIAGNOSIS — E6609 Other obesity due to excess calories: Secondary | ICD-10-CM

## 2024-06-06 DIAGNOSIS — I119 Hypertensive heart disease without heart failure: Secondary | ICD-10-CM | POA: Diagnosis not present

## 2024-06-06 DIAGNOSIS — I2584 Coronary atherosclerosis due to calcified coronary lesion: Secondary | ICD-10-CM | POA: Diagnosis not present

## 2024-06-06 DIAGNOSIS — Z0000 Encounter for general adult medical examination without abnormal findings: Secondary | ICD-10-CM

## 2024-06-06 DIAGNOSIS — E7800 Pure hypercholesterolemia, unspecified: Secondary | ICD-10-CM

## 2024-06-06 DIAGNOSIS — M25531 Pain in right wrist: Secondary | ICD-10-CM

## 2024-06-06 LAB — POCT URINALYSIS DIP (CLINITEK)
Bilirubin, UA: NEGATIVE
Blood, UA: NEGATIVE
Glucose, UA: NEGATIVE mg/dL
Ketones, POC UA: NEGATIVE mg/dL
Leukocytes, UA: NEGATIVE
Nitrite, UA: NEGATIVE
POC PROTEIN,UA: NEGATIVE
Spec Grav, UA: 1.02 (ref 1.010–1.025)
Urobilinogen, UA: 0.2 U/dL
pH, UA: 6 (ref 5.0–8.0)

## 2024-06-06 NOTE — Progress Notes (Signed)
 I,Rita Roberts, CMA,acting as a Neurosurgeon for Rita LOISE Slocumb, MD.,have documented all relevant documentation on the behalf of Rita LOISE Slocumb, MD,as directed by  Rita LOISE Slocumb, MD while in the presence of Rita LOISE Slocumb, MD.  Subjective:  Patient ID: Rita Roberts , female    DOB: Oct 11, 1956 , 68 y.o.   MRN: [id number]  Chief Complaint  Patient presents with   Annual Exam    Patient presents today for annual exam. She reports compliance with medications. Denies headache, chest pain & sob. GYN: Dr Armond She complains of pain in her right wrist. This has been an ongoing issue for a few months.    Hypertension    HPI Discussed the use of AI scribe software for clinical note transcription with the patient, who gave verbal consent to proceed.  History of Present Illness Rita Roberts is a 68 year old female with hypertension who presents for a physical and blood pressure check.  She experiences cramps in her right wrist and forearm, which have been worsening recently. The cramps originate in the wrist and radiate to the forearm. No tingling or weakness in the hand is noted, but the sensation is described as cramping pain. She has been using a wrist brace to manage the symptoms.  Her blood pressure at home was noted to be 134/unknown. She is currently taking amlodipine  5 mg at night, atorvastatin  20 mg, aspirin , and valsartan  in the morning. She engages in physical activity, primarily using resistance bands due to the hot weather, and reports walking regularly when possible. She is monitoring her diet, trying to avoid salt and preferring pepper instead. She consumes chips occasionally.  She reports regular bowel movements, occurring at least twice a day. There is no family history of heart disease. She has a history of a calcium  score of 95, for which she is taking atorvastatin  daily.   Hypertension This is a chronic problem. The current episode started more than 1 year ago. The problem  has been gradually improving since onset. The problem is uncontrolled. Pertinent negatives include no blurred vision, chest pain, palpitations or shortness of breath. Risk factors for coronary artery disease include post-menopausal state. Past treatments include angiotensin blockers. The current treatment provides moderate improvement.     Past Medical History:  Diagnosis Date   Anemia    Arthritis    probably in left shoulder (02/09/2018)   Breast cancer, left breast (HCC) 11/2017   Family history of adverse reaction to anesthesia    my mom may have; not sure (02/09/2018)   GERD (gastroesophageal reflux disease)    Heartburn    Pneumonia 1960     Family History  Problem Relation Age of Onset   Dementia Mother    Lung cancer Father    Healthy Sister      Current Outpatient Medications:    amLODipine  (NORVASC ) 5 MG tablet, Take 1 tablet (5 mg total) by mouth daily., Disp: 90 tablet, Rfl: 2   aspirin  EC 81 MG tablet, Take 1 tablet (81 mg total) by mouth daily. Swallow whole., Disp: , Rfl:    atorvastatin  (LIPITOR) 20 MG tablet, Take 1 tablet (20 mg total) by mouth daily., Disp: 90 tablet, Rfl: 1   Blood Pressure Monitoring (B-D ASSURE BPM/DELUXE ARM CUFF) MISC, Use to check bp daily, Disp: 1 each, Rfl: 0   ibuprofen (ADVIL,MOTRIN) 200 MG tablet, Take 400 mg by mouth daily as needed for headache or moderate pain., Disp: , Rfl:  valsartan  (DIOVAN ) 160 MG tablet, TAKE 1 TABLET(160 MG) BY MOUTH DAILY, Disp: 90 tablet, Rfl: 2   No Known Allergies   Review of Systems  Constitutional: Negative.   HENT: Negative.    Eyes: Negative.  Negative for blurred vision.  Respiratory: Negative.  Negative for shortness of breath.   Cardiovascular: Negative.  Negative for chest pain and palpitations.  Gastrointestinal: Negative.   Endocrine: Negative.   Genitourinary: Negative.   Musculoskeletal: Negative.   Skin: Negative.   Allergic/Immunologic: Negative.   Neurological: Negative.    Hematological: Negative.   Psychiatric/Behavioral: Negative.       Today's Vitals   06/06/24 1410 06/06/24 1422  BP: (!) 150/100 (!) 148/90  Pulse: 66   Temp: 98.9 F (37.2 C)   SpO2: 98%   Weight: 184 lb (83.5 kg)   Height: 5' 1 (1.549 m)    Body mass index is 34.77 kg/m.  Wt Readings from Last 3 Encounters:  06/06/24 184 lb (83.5 kg)  02/02/24 189 lb 12.8 oz (86.1 kg)  09/26/23 187 lb 3.2 oz (84.9 kg)    BP Readings from Last 3 Encounters:  06/06/24 (!) 148/90  02/02/24 (!) 140/90  09/26/23 134/86     Objective:  Physical Exam Vitals and nursing note reviewed.  Constitutional:      Appearance: Normal appearance.  HENT:     Head: Normocephalic and atraumatic.     Right Ear: Tympanic membrane, ear canal and external ear normal.     Left Ear: Tympanic membrane, ear canal and external ear normal.     Nose: Nose normal.     Mouth/Throat:     Mouth: Mucous membranes are moist.     Pharynx: Oropharynx is clear.  Eyes:     Extraocular Movements: Extraocular movements intact.     Conjunctiva/sclera: Conjunctivae normal.     Pupils: Pupils are equal, round, and reactive to light.  Cardiovascular:     Rate and Rhythm: Normal rate and regular rhythm.     Pulses: Normal pulses.     Heart sounds: Normal heart sounds.  Pulmonary:     Effort: Pulmonary effort is normal.     Breath sounds: Normal breath sounds.  Chest:  Breasts:    Tanner Score is 5.     Right: Normal.     Left: Absent.  Abdominal:     General: Abdomen is flat. Bowel sounds are normal.     Palpations: Abdomen is soft.  Genitourinary:    Comments: deferred Musculoskeletal:        General: Tenderness present. Normal range of motion.     Cervical back: Normal range of motion and neck supple.     Comments: Right wrist tenderness w/ flexion Neg Phalen's  Neg Tinel's  Skin:    General: Skin is warm and dry.  Neurological:     General: No focal deficit present.     Mental Status: She is alert and  oriented to person, place, and time.  Psychiatric:        Mood and Affect: Mood normal.        Behavior: Behavior normal.         Assessment And Plan:  Encounter for general adult medical examination w/o abnormal findings Assessment & Plan: A full exam was performed.  Importance of monthly self breast exams was discussed with the patient.  She is advised to get 30-45 minutes of regular exercise, no less than four to five days per week. Both weight-bearing and aerobic  exercises are recommended.  She is advised to follow a healthy diet with at least six fruits/veggies per day, decrease intake of red meat and other saturated fats and to increase fish intake to twice weekly.  Meats/fish should not be fried -- baked, boiled or broiled is preferable. It is also important to cut back on your sugar intake.  Be sure to read labels - try to avoid anything with added sugar, high fructose corn syrup or other sweeteners.  If you must use a sweetener, you can try stevia or monkfruit.  It is also important to avoid artificially sweetened foods/beverages and diet drinks. Lastly, wear SPF 50 sunscreen on exposed skin and when in direct sunlight for an extended period of time.  Be sure to avoid fast food restaurants and aim for at least 60 ounces of water daily.      Orders: -     CBC -     CMP14+EGFR -     Lipid panel -     TSH  Hypertensive heart disease without heart failure Assessment & Plan: Chronic, uncontrolled. EKG performed, SB w/o acute changes.  Blood pressure managed with amlodipine , valsartan , atorvastatin , and aspirin . Home readings should be below 130/80 mmHg. Discussed sodium reduction. - Continue amlodipine , valsartan , atorvastatin , and aspirin . - Monitor home blood pressure for readings below 130/80 mmHg. - Consider increasing amlodipine  if goals are unmet. - RTO for NV in 3 months  Orders: -     POCT URINALYSIS DIP (CLINITEK) -     Microalbumin / creatinine urine ratio -     EKG  12-Lead  Coronary artery calcification of native artery Assessment & Plan: Calcium  score is 95.  LDL goal is less than 70. She is currently taking atorvastatin  20mg  daily. She is encouraged to take ASA and statin daily.  - She has been referred to Cardiology; however, she has yet to establish care - Follow heart healthy lifestyle - Take all meds as prescribed - Exercise at least 150 minutes per week   Right wrist pain Assessment & Plan: Chronic pain likely due to muscle strain. Using wrist brace. - Use Voltaren gel twice daily on affected area. - Consider orthopedic or hand evaluation if no improvement in 7-10 days.  Orders: -     ANA, IFA (with reflex)  Class 1 obesity due to excess calories with serious comorbidity and body mass index (BMI) of 34.0 to 34.9 in adult Assessment & Plan: She is encouraged to strive for BMI less than 30 to decrease cardiac risk. Advised to aim for at least 150 minutes of exercise per week.      Return in 3 months (on 09/06/2024), or NV - bp check, for 1 year physical, 6 month bp.  Patient was given opportunity to ask questions. Patient verbalized understanding of the plan and was able to repeat key elements of the plan. All questions were answered to their satisfaction.   I, Rita LOISE Slocumb, MD, have reviewed all documentation for this visit. The documentation on 06/06/24 for the exam, diagnosis, procedures, and orders are all accurate and complete.   IF YOU HAVE BEEN REFERRED TO A SPECIALIST, IT MAY TAKE 1-2 WEEKS TO SCHEDULE/PROCESS THE REFERRAL. IF YOU HAVE NOT HEARD FROM US /SPECIALIST IN TWO WEEKS, PLEASE GIVE US  A CALL AT [phone] X [phone].   THE PATIENT IS ENCOURAGED TO PRACTICE SOCIAL DISTANCING DUE TO THE COVID-19 PANDEMIC.

## 2024-06-06 NOTE — Patient Instructions (Addendum)

## 2024-06-09 LAB — CBC
Hematocrit: 42.7 % (ref 34.0–46.6)
Hemoglobin: 13.6 g/dL (ref 11.1–15.9)
MCH: 29 pg (ref 26.6–33.0)
MCHC: 31.9 g/dL (ref 31.5–35.7)
MCV: 91 fL (ref 79–97)
Platelets: 226 x10E3/uL (ref 150–450)
RBC: 4.69 x10E6/uL (ref 3.77–5.28)
RDW: 12.6 % (ref 11.7–15.4)
WBC: 5.1 x10E3/uL (ref 3.4–10.8)

## 2024-06-09 LAB — MICROALBUMIN / CREATININE URINE RATIO

## 2024-06-09 LAB — CMP14+EGFR
ALT: 17 IU/L (ref 0–32)
AST: 20 IU/L (ref 0–40)
Albumin: 4.4 g/dL (ref 3.9–4.9)
Alkaline Phosphatase: 76 IU/L (ref 44–121)
BUN/Creatinine Ratio: 12 (ref 12–28)
BUN: 10 mg/dL (ref 8–27)
Bilirubin Total: 1.1 mg/dL (ref 0.0–1.2)
CO2: 23 mmol/L (ref 20–29)
Calcium: 9.9 mg/dL (ref 8.7–10.3)
Chloride: 102 mmol/L (ref 96–106)
Creatinine, Ser: 0.84 mg/dL (ref 0.57–1.00)
Globulin, Total: 2.8 g/dL (ref 1.5–4.5)
Glucose: 86 mg/dL (ref 70–99)
Potassium: 4.2 mmol/L (ref 3.5–5.2)
Sodium: 141 mmol/L (ref 134–144)
Total Protein: 7.2 g/dL (ref 6.0–8.5)
eGFR: 77 mL/min/1.73 (ref >59)

## 2024-06-09 LAB — LIPID PANEL
Chol/HDL Ratio: 4.4 ratio (ref 0.0–4.4)
Cholesterol, Total: 217 mg/dL — ABNORMAL HIGH (ref 100–199)
HDL: 49 mg/dL (ref >39)
LDL Chol Calc (NIH): 157 mg/dL — ABNORMAL HIGH (ref 0–99)
Triglycerides: 61 mg/dL (ref 0–149)
VLDL Cholesterol Cal: 11 mg/dL (ref 5–40)

## 2024-06-09 LAB — TSH: TSH: 0.8 u[IU]/mL (ref 0.450–4.500)

## 2024-06-09 LAB — ANTINUCLEAR ANTIBODIES, IFA: ANA Titer 1: NEGATIVE

## 2024-06-10 DIAGNOSIS — Z0000 Encounter for general adult medical examination without abnormal findings: Secondary | ICD-10-CM | POA: Insufficient documentation

## 2024-06-10 DIAGNOSIS — E66811 Obesity, class 1: Secondary | ICD-10-CM | POA: Insufficient documentation

## 2024-06-10 DIAGNOSIS — M25531 Pain in right wrist: Secondary | ICD-10-CM | POA: Insufficient documentation

## 2024-06-10 NOTE — Assessment & Plan Note (Signed)
 Chronic pain likely due to muscle strain. Using wrist brace. - Use Voltaren gel twice daily on affected area. - Consider orthopedic or hand evaluation if no improvement in 7-10 days.

## 2024-06-10 NOTE — Assessment & Plan Note (Addendum)
 Chronic, uncontrolled. EKG performed, SB w/o acute changes.  Blood pressure managed with amlodipine , valsartan , atorvastatin , and aspirin . Home readings should be below 130/80 mmHg. Discussed sodium reduction. - Continue amlodipine , valsartan , atorvastatin , and aspirin . - Monitor home blood pressure for readings below 130/80 mmHg. - Consider increasing amlodipine  if goals are unmet. - RTO for NV in 3 months

## 2024-06-10 NOTE — Assessment & Plan Note (Signed)

## 2024-06-10 NOTE — Assessment & Plan Note (Addendum)
 Calcium  score is 95.  LDL goal is less than 70. She is currently taking atorvastatin  20mg  daily. She is encouraged to take ASA and statin daily.  - She has been referred to Cardiology; however, she has yet to establish care - Follow heart healthy lifestyle - Take all meds as prescribed - Exercise at least 150 minutes per week

## 2024-06-10 NOTE — Assessment & Plan Note (Signed)
 She is encouraged to strive for BMI less than 30 to decrease cardiac risk. Advised to aim for at least 150 minutes of exercise per week.

## 2024-06-11 ENCOUNTER — Ambulatory Visit: Payer: Self-pay | Admitting: Internal Medicine

## 2024-06-12 ENCOUNTER — Other Ambulatory Visit: Payer: Self-pay

## 2024-06-12 MED ORDER — ATORVASTATIN CALCIUM 40 MG PO TABS: ORAL_TABLET | ORAL | 1 refills | Status: AC

## 2024-06-20 ENCOUNTER — Other Ambulatory Visit: Payer: Self-pay

## 2024-06-20 DIAGNOSIS — I119 Hypertensive heart disease without heart failure: Secondary | ICD-10-CM

## 2024-06-20 MED ORDER — VALSARTAN 160 MG PO TABS: ORAL_TABLET | ORAL | 2 refills | Status: AC

## 2024-09-11 ENCOUNTER — Ambulatory Visit: Payer: Self-pay

## 2024-09-11 VITALS — BP 130/80 | HR 61 | Temp 98.5°F | Ht 61.0 in | Wt 184.0 lb

## 2024-09-11 DIAGNOSIS — Z2821 Immunization not carried out because of patient refusal: Secondary | ICD-10-CM

## 2024-09-11 DIAGNOSIS — I10 Essential (primary) hypertension: Secondary | ICD-10-CM

## 2024-09-11 NOTE — Progress Notes (Addendum)
 Patient is in office today for a nurse visit for Blood Pressure Check. Patient currently taking amLODipine  5mg  AM and Valsartan  160mg  AM. Patient blood pressure was 140/80, Patient No chest pain, No shortness of breath, No dyspnea on exertion, No orthopnea, No paroxysmal nocturnal dyspnea, No edema, No palpitations, No syncope Patient reports she didn't she didn't take her amLODipine  this morning- Patient doesn't have a reason why.  BP Readings from Last 3 Encounters:  09/11/24 (!) 140/80  06/06/24 (!) 148/90  02/02/24 (!) 140/90   Per provider- Increase exercise and if BP is still elevated at next visit  amlodipine  will be increased- advise her to take valsartan  in am and amlodipine  in pm please

## 2024-09-28 ENCOUNTER — Encounter: Payer: Self-pay | Admitting: Internal Medicine

## 2024-12-10 ENCOUNTER — Ambulatory Visit: Payer: Self-pay | Admitting: Internal Medicine

## 2025-06-11 ENCOUNTER — Encounter: Payer: Self-pay | Admitting: Internal Medicine

## 2025-06-11 ENCOUNTER — Encounter: Admitting: Internal Medicine
